# Patient Record
Sex: Male | Born: 1935 | Race: Black or African American | Hispanic: No | Marital: Single | Smoking: Never smoker
Health system: Southern US, Community
[De-identification: ages and names within clinical notes are randomized; demographics above are authoritative.]

## PROBLEM LIST (undated history)

## (undated) DIAGNOSIS — I459 Conduction disorder, unspecified: Secondary | ICD-10-CM

## (undated) DIAGNOSIS — M7989 Other specified soft tissue disorders: Secondary | ICD-10-CM

## (undated) DIAGNOSIS — M79643 Pain in unspecified hand: Secondary | ICD-10-CM

## (undated) DIAGNOSIS — Z950 Presence of cardiac pacemaker: Secondary | ICD-10-CM

## (undated) DIAGNOSIS — M8580 Other specified disorders of bone density and structure, unspecified site: Secondary | ICD-10-CM

## (undated) DIAGNOSIS — I82629 Acute embolism and thrombosis of deep veins of unspecified upper extremity: Secondary | ICD-10-CM

## (undated) DIAGNOSIS — R079 Chest pain, unspecified: Secondary | ICD-10-CM

## (undated) HISTORY — DX: Other specified soft tissue disorders: M79.89

## (undated) HISTORY — DX: Presence of cardiac pacemaker: Z95.0

## (undated) HISTORY — DX: Conduction disorder, unspecified: I45.9

## (undated) HISTORY — PX: PACEMAKER INSERTION: SHX728

## (undated) HISTORY — DX: Pain in unspecified hand: M79.643

## (undated) HISTORY — DX: Other specified disorders of bone density and structure, unspecified site: M85.80

## (undated) HISTORY — DX: Acute embolism and thrombosis of deep veins of unspecified upper extremity: I82.629

## (undated) HISTORY — DX: Chest pain, unspecified: R07.9

---

## 1999-07-11 ENCOUNTER — Encounter: Payer: Self-pay | Admitting: Emergency Medicine

## 1999-07-11 ENCOUNTER — Emergency Department (HOSPITAL_COMMUNITY): Admission: EM | Admit: 1999-07-11 | Discharge: 1999-07-11 | Payer: Self-pay | Admitting: Emergency Medicine

## 2000-02-02 ENCOUNTER — Emergency Department (HOSPITAL_COMMUNITY): Admission: EM | Admit: 2000-02-02 | Discharge: 2000-02-02 | Payer: Self-pay | Admitting: Emergency Medicine

## 2000-02-02 ENCOUNTER — Encounter: Payer: Self-pay | Admitting: Emergency Medicine

## 2000-02-05 ENCOUNTER — Emergency Department (HOSPITAL_COMMUNITY): Admission: EM | Admit: 2000-02-05 | Discharge: 2000-02-05 | Payer: Self-pay | Admitting: Emergency Medicine

## 2000-02-06 ENCOUNTER — Inpatient Hospital Stay (HOSPITAL_COMMUNITY): Admission: EM | Admit: 2000-02-06 | Discharge: 2000-02-19 | Payer: Self-pay | Admitting: Emergency Medicine

## 2000-02-07 ENCOUNTER — Encounter: Payer: Self-pay | Admitting: Family Medicine

## 2000-02-08 ENCOUNTER — Encounter: Payer: Self-pay | Admitting: Family Medicine

## 2000-02-12 ENCOUNTER — Encounter: Payer: Self-pay | Admitting: Cardiovascular Disease

## 2000-02-13 ENCOUNTER — Encounter: Payer: Self-pay | Admitting: Cardiovascular Disease

## 2000-02-14 ENCOUNTER — Encounter: Payer: Self-pay | Admitting: Family Medicine

## 2000-02-18 ENCOUNTER — Encounter: Payer: Self-pay | Admitting: Family Medicine

## 2016-06-28 ENCOUNTER — Encounter (HOSPITAL_COMMUNITY): Payer: Self-pay

## 2016-06-28 ENCOUNTER — Inpatient Hospital Stay (HOSPITAL_COMMUNITY)
Admission: EM | Admit: 2016-06-28 | Discharge: 2016-07-01 | DRG: 259 | Disposition: A | Discharge status: Home / Self Care | Payer: Medicare Other | Attending: Cardiovascular Disease | Admitting: Cardiovascular Disease

## 2016-06-28 ENCOUNTER — Emergency Department (HOSPITAL_COMMUNITY): Payer: Medicare Other

## 2016-06-28 ENCOUNTER — Emergency Department (HOSPITAL_COMMUNITY)
Admit: 2016-06-28 | Discharge: 2016-06-28 | Disposition: A | Discharge status: Home / Self Care | Payer: Medicare Other | Attending: Emergency Medicine | Admitting: Emergency Medicine

## 2016-06-28 DIAGNOSIS — Z950 Presence of cardiac pacemaker: Secondary | ICD-10-CM

## 2016-06-28 DIAGNOSIS — I82621 Acute embolism and thrombosis of deep veins of right upper extremity: Secondary | ICD-10-CM | POA: Diagnosis present

## 2016-06-28 DIAGNOSIS — Z45010 Encounter for checking and testing of cardiac pacemaker pulse generator [battery]: Secondary | ICD-10-CM | POA: Diagnosis not present

## 2016-06-28 DIAGNOSIS — I10 Essential (primary) hypertension: Secondary | ICD-10-CM | POA: Diagnosis present

## 2016-06-28 DIAGNOSIS — M7989 Other specified soft tissue disorders: Secondary | ICD-10-CM | POA: Diagnosis not present

## 2016-06-28 DIAGNOSIS — Z23 Encounter for immunization: Secondary | ICD-10-CM | POA: Diagnosis present

## 2016-06-28 DIAGNOSIS — M79609 Pain in unspecified limb: Secondary | ICD-10-CM | POA: Diagnosis not present

## 2016-06-28 DIAGNOSIS — T82111A Breakdown (mechanical) of cardiac pulse generator (battery), initial encounter: Secondary | ICD-10-CM

## 2016-06-28 DIAGNOSIS — I442 Atrioventricular block, complete: Secondary | ICD-10-CM | POA: Diagnosis present

## 2016-06-28 DIAGNOSIS — Z9119 Patient's noncompliance with other medical treatment and regimen: Secondary | ICD-10-CM

## 2016-06-28 DIAGNOSIS — M79601 Pain in right arm: Secondary | ICD-10-CM

## 2016-06-28 LAB — COMPREHENSIVE METABOLIC PANEL
ALT: 14 U/L — ABNORMAL LOW (ref 17–63)
AST: 22 U/L (ref 15–41)
Albumin: 3.6 g/dL (ref 3.5–5.0)
Alkaline Phosphatase: 62 U/L (ref 38–126)
Anion gap: 13 (ref 5–15)
BILIRUBIN TOTAL: 1.3 mg/dL — AB (ref 0.3–1.2)
BUN: 15 mg/dL (ref 6–20)
CHLORIDE: 101 mmol/L (ref 101–111)
CO2: 24 mmol/L (ref 22–32)
Calcium: 9 mg/dL (ref 8.9–10.3)
Creatinine, Ser: 1.1 mg/dL (ref 0.61–1.24)
GFR calc Af Amer: >60 mL/min (ref >60)
GFR calc non Af Amer: >60 mL/min (ref >60)
GLUCOSE: 158 mg/dL — AB (ref 65–99)
POTASSIUM: 3.8 mmol/L (ref 3.5–5.1)
SODIUM: 138 mmol/L (ref 135–145)
TOTAL PROTEIN: 7.5 g/dL (ref 6.5–8.1)

## 2016-06-28 LAB — CREATININE, SERUM
Creatinine, Ser: 1.15 mg/dL (ref 0.61–1.24)
GFR calc Af Amer: >60 mL/min (ref >60)
GFR calc non Af Amer: 59 mL/min — ABNORMAL LOW (ref >60)

## 2016-06-28 LAB — CBC
HCT: 43.1 % (ref 39.0–52.0)
HEMATOCRIT: 44.5 % (ref 39.0–52.0)
HEMOGLOBIN: 14.1 g/dL (ref 13.0–17.0)
Hemoglobin: 14.6 g/dL (ref 13.0–17.0)
MCH: 22.8 pg — ABNORMAL LOW (ref 26.0–34.0)
MCH: 22.7 pg — AB (ref 26.0–34.0)
MCHC: 32.8 g/dL (ref 30.0–36.0)
MCHC: 32.7 g/dL (ref 30.0–36.0)
MCV: 69.5 fL — ABNORMAL LOW (ref 78.0–100.0)
MCV: 69.4 fL — AB (ref 78.0–100.0)
Platelets: 218 10*3/uL (ref 150–400)
Platelets: 199 10*3/uL (ref 150–400)
RBC: 6.4 MIL/uL — ABNORMAL HIGH (ref 4.22–5.81)
RBC: 6.21 MIL/uL — AB (ref 4.22–5.81)
RDW: 14.3 % (ref 11.5–15.5)
RDW: 14.3 % (ref 11.5–15.5)
WBC: 6.4 10*3/uL (ref 4.0–10.5)
WBC: 6 10*3/uL (ref 4.0–10.5)

## 2016-06-28 LAB — URINALYSIS, ROUTINE W REFLEX MICROSCOPIC
Bilirubin Urine: NEGATIVE
Glucose, UA: NEGATIVE mg/dL
Hgb urine dipstick: NEGATIVE
Ketones, ur: NEGATIVE mg/dL
LEUKOCYTES UA: NEGATIVE
NITRITE: NEGATIVE
PH: 5 (ref 5.0–8.0)
Protein, ur: NEGATIVE mg/dL
SPECIFIC GRAVITY, URINE: 1.016 (ref 1.005–1.030)

## 2016-06-28 LAB — PROTIME-INR
INR: 1.1
PROTHROMBIN TIME: 14.3 s (ref 11.4–15.2)

## 2016-06-28 LAB — TSH: TSH: 1.647 u[IU]/mL (ref 0.350–4.500)

## 2016-06-28 LAB — I-STAT TROPONIN, ED: Troponin i, poc: 0.01 ng/mL (ref 0.00–0.08)

## 2016-06-28 MED ORDER — PNEUMOCOCCAL VAC POLYVALENT 25 MCG/0.5ML IJ INJ
0.5000 mL | INJECTION | INTRAMUSCULAR | Status: AC
  Administered 2016-06-29: 0.5 mL via INTRAMUSCULAR
  Filled 2016-06-28: qty 0.5

## 2016-06-28 MED ORDER — HEPARIN SODIUM (PORCINE) 5000 UNIT/ML IJ SOLN
5000.0000 [IU] | Freq: Three times a day (TID) | INTRAMUSCULAR | Status: DC
  Administered 2016-06-28 – 2016-07-01 (×7): 5000 [IU] via SUBCUTANEOUS
  Filled 2016-06-28 (×7): qty 1

## 2016-06-28 NOTE — ED Provider Notes (Signed)
[facility]-EMERGENCY DEPT Provider Note   CSN: [id number] Arrival date & time: 06/28/16  1130     History   Chief Complaint Chief Complaint  Patient presents with  . Hand Pain    HPI Steve Chapman is a 82 y.o. male.  The history is provided by the patient.  Extremity Pain  This is a new problem. Episode onset: 2 weeks ago. The problem occurs constantly. The problem has been gradually worsening. Pertinent negatives include no chest pain and no shortness of breath. Nothing aggravates the symptoms. Nothing relieves the symptoms. He has tried nothing for the symptoms.    History reviewed. No pertinent past medical history.  There are no active problems to display for this patient.   Past Surgical History:  Procedure Laterality Date  . PACEMAKER INSERTION         Home Medications    Prior to Admission medications   Not on File    Family History History reviewed. No pertinent family history.  Social History Social History  Substance Use Topics  . Smoking status: Never Smoker  . Smokeless tobacco: Never Used  . Alcohol use Not on file     Allergies   Patient has no known allergies.   Review of Systems Review of Systems  Respiratory: Negative for shortness of breath.   Cardiovascular: Negative for chest pain.  All other systems reviewed and are negative.    Physical Exam Updated Vital Signs BP 192/66   Pulse (!) 40   Temp 98.7 F (37.1 C) (Oral)   Resp 20   Ht 5\' 6"  (1.676 m)   Wt 150 lb (68 kg)   SpO2 99%   BMI 24.21 kg/m   Physical Exam  Constitutional: He is oriented to person, place, and time. He appears well-developed and well-nourished. No distress.  HENT:  Head: Normocephalic and atraumatic.  Nose: Nose normal.  Eyes: Conjunctivae are normal.  Neck: Neck supple. No tracheal deviation present.  Cardiovascular: Regular rhythm and normal heart sounds.  Bradycardia present.   Pulmonary/Chest: Effort normal and breath sounds normal. No  respiratory distress.  Abdominal: Soft. He exhibits no distension.  Musculoskeletal:  RUE swelling and tenderness of right hand  Neurological: He is alert and oriented to person, place, and time.  Skin: Skin is warm and dry.  Psychiatric: He has a normal mood and affect.     ED Treatments / Results  Labs (all labs ordered are listed, but only abnormal results are displayed) Labs Reviewed  CBC - Abnormal; Notable for the following:       Result Value   RBC 6.40 (*)    MCV 69.5 (*)    MCH 22.8 (*)    All other components within normal limits  COMPREHENSIVE METABOLIC PANEL - Abnormal; Notable for the following:    Glucose, Bld 158 (*)    ALT 14 (*)    Total Bilirubin 1.3 (*)    All other components within normal limits  CBC - Abnormal; Notable for the following:    RBC 6.21 (*)    MCV 69.4 (*)    MCH 22.7 (*)    All other components within normal limits  CREATININE, SERUM - Abnormal; Notable for the following:    GFR calc non Af Amer 59 (*)    All other components within normal limits  URINALYSIS, ROUTINE W REFLEX MICROSCOPIC  PROTIME-INR  TSH  I-STAT TROPOININ, ED    EKG  EKG Interpretation  Date/Time:  Saturday June 28 2016 11:58:49  EST Ventricular Rate:  40 PR Interval:    QRS Duration: 130 QT Interval:  506 QTC Calculation: 412 R Axis:   -10 Text Interpretation:  Complete (3-degree) AV block Left ventricular hypertrophy with QRS widening Inferior infarct , age undetermined Abnormal ECG Confirmed by Jannell Franta MD, Reuel BoomANIEL [phone]([id number]) on 06/28/2016 12:40:51 PM       Radiology Dg Hand 2 View Right  Result Date: 06/28/2016 CLINICAL DATA:  82 year old male with pain and swelling of the right hand, no known injury EXAM: RIGHT HAND - 2 VIEW COMPARISON:  None. FINDINGS: The bones appear diffusely demineralized, particularly in the juxta articular regions of the phalanges. There is soft tissue swelling involving the digits in the ulnar aspect of the hand. No evidence of acute  fracture or malalignment. No imbedded radiopaque foreign object. Minimal degenerative changes in the distal interphalangeal joint consistent with very mild osteoarthritis. IMPRESSION: Soft tissue swelling without evidence of acute osseous abnormalities or radiopaque foreign body. Juxta-articular osteopenia. Does the patient have a clinical history of inflammatory arthropathy? Electronically Signed   By: Malachy MoanHeath  McCullough M.D.   On: 06/28/2016 13:06   Dg Chest Portable 1 View  Result Date: 06/28/2016 CLINICAL DATA:  Midsternal chest pain. EXAM: PORTABLE CHEST 1 VIEW COMPARISON:  None. FINDINGS: A 2 lead pacemaker is identified over the right chest. Two transcutaneous pacers obscure the left medial lower chest. No nodules, masses, or focal infiltrates. The cardiomediastinal silhouette is unremarkable. IMPRESSION: No active disease. Electronically Signed   By: Gerome Samavid  Williams III M.D   On: 06/28/2016 13:06    Procedures Procedures (including critical care time)  CRITICAL CARE Performed by: Lyndal PulleyKnott, Arrayah Connors Total critical care time: 30 minutes Critical care time was exclusive of separately billable procedures and treating other patients. Critical care was necessary to treat or prevent imminent or life-threatening deterioration. Critical care was time spent personally by me on the following activities: development of treatment plan with patient and/or surrogate as well as nursing, discussions with consultants, evaluation of patient's response to treatment, examination of patient, obtaining history from patient or surrogate, ordering and performing treatments and interventions, ordering and review of laboratory studies, ordering and review of radiographic studies, pulse oximetry and re-evaluation of patient's condition.  Medications Ordered in ED Medications - No data to display   Initial Impression / Assessment and Plan / ED Course  I have reviewed the triage vital signs and the nursing  notes.  Pertinent labs & imaging results that were available during my care of the patient were reviewed by me and considered in my medical decision making (see chart for details).  Clinical Course     82 y.o. male presents with complaint of right arm pain and swelling. EKG from triage shows complete heart block. Discussed with cardiology who came to see Pt and it is believed he did not receive any f/u after remote pacemaker placement and likely has a nonfunctional pacer. It is right sided so US done to eval for DVT and is negative. Pt is poor historian and chronically noncompliant. Cardiology to admit for pacer replacement.   Final Clinical Impressions(s) / ED Diagnoses   Final diagnoses:  Right arm pain  Complete heart block [facility](HCC)    New Prescriptions New Prescriptions   No medications on file     Lyndal Pulleyaniel Yulian Gosney, MD 06/28/16 2102

## 2016-06-28 NOTE — ED Notes (Signed)
Cardiologist at bedside.  

## 2016-06-28 NOTE — ED Triage Notes (Signed)
Pt presents with 2-3 day h/o midsternal chest pain.  Pt reports pain is intermittent and does not radiate.  +shortness of breath, denies nausea.  Pt also reports pain and swelling to R hand, denies any injury, pain x 1 week.

## 2016-06-28 NOTE — ED Notes (Signed)
Assisted pt with bedside commode. Pt had a large loose bowel movement.

## 2016-06-28 NOTE — H&P (Signed)
 Chief Complaint:  Right hand swelling and pain  HPI:  This is an 82 y.o. male with a past medical history significant for pacemaker implantation in 2001 for unknown diagnosis. He has not seen the implanting physician (Dr. Weintraub) and at least the last 6-1/2 years (when the office was still on [address] and has not had a pacemaker check in at least that length of time. Device manufacturer unknown. 2 leads seen on CXR.  He presents today with roughly 2 month complaints of intermittent swelling and discomfort in his right hand. It appears that elevating the limb will lead to improvement in the swelling but then it returns. Recently he has also had some intense but brief and sharp chest pain. It is not clearly pleuritic. He has had some dry nonproductive cough without hemoptysis. He denies dyspnea and has not had exertional angina or exertional shortness of breath. He denies syncope or palpitations.  Incidentally noted during his ER evaluation is the fact that his pacemaker is completely nonfunctional. He has complete heart block with an idioventricular escape rhythm at about 37-40 bpm.  As far as I can tell he has not had significant medical problems. 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   Specifically denies a history of hypertension or diabetes mellitus, PAD, previous stroke or TIA, previous syncope or congestive heart failure. He has never smoked. The only medications he takes is an occasional Tylenol for his hand discomfort.  He lives alone. He is accompanied to the emergency department by his niece Beth and grandniece Kendra. They are concerned about his ability to take care of himself at home  PMHx:  History reviewed. No pertinent past medical history.  Past Surgical History:  Procedure Laterality Date  . PACEMAKER INSERTION      FAMHx:  History reviewed. No pertinent family history.  SOCHx:   reports that he has never  smoked. He has never used smokeless tobacco. His alcohol and drug histories are not on file.  ALLERGIES:  No Known Allergies  ROS: Pertinent items noted in HPI and remainder of comprehensive ROS otherwise negative.  HOME MEDS:  (Not in a hospital admission)  LABS/IMAGING: Results for orders placed or performed during the hospital encounter of 06/28/16 (from the past 48 hour(s))  CBC     Status: Abnormal   Collection Time: 06/28/16 11:55 AM  Result Value Ref Range   WBC 6.4 4.0 - 10.5 K/uL   RBC 6.40 (H) 4.22 - 5.81 MIL/uL   Hemoglobin 14.6 13.0 - 17.0 g/dL   HCT 44.5 39.0 - 52.0 %   MCV 69.5 (L) 78.0 - 100.0 fL   MCH 22.8 (L) 26.0 - 34.0 pg   MCHC 32.8 30.0 - 36.0 g/dL   RDW 14.3 11.5 - 15.5 %   Platelets 218 150 - 400 K/uL  Comprehensive metabolic panel     Status: Abnormal   Collection Time: 06/28/16 11:55 AM  Result Value Ref Range   Sodium 138 135 - 145 mmol/L   Potassium 3.8 3.5 - 5.1 mmol/L   Chloride 101 101 - 111 mmol/L   CO2 24 22 - 32 mmol/L   Glucose, Bld 158 (H) 65 - 99 mg/dL   BUN 15 6 - 20 mg/dL   Creatinine, Ser 1.10 0.61 - 1.24 mg/dL   Calcium 9.0 8.9 - 10.3 mg/dL   Total Protein 7.5 6.5 - 8.1 g/dL   Albumin 3.6 3.5 -   5.0 g/dL   AST 22 15 - 41 U/L   ALT 14 (L) 17 - 63 U/L   Alkaline Phosphatase 62 38 - 126 U/L   Total Bilirubin 1.3 (H) 0.3 - 1.2 mg/dL   GFR calc non Af Amer >60 >60 mL/min   GFR calc Af Amer >60 >60 mL/min    Comment: (NOTE) The eGFR has been calculated using the CKD EPI equation. This calculation has not been validated in all clinical situations. eGFR's persistently <60 mL/min signify possible Chronic Kidney Disease.    Anion gap 13 5 - 15  I-stat troponin, ED (not at [facility])     Status: None   Collection Time: 06/28/16 12:17 PM  Result Value Ref Range   Troponin i, poc 0.01 0.00 - 0.08 ng/mL   Comment 3            Comment: Due to the release kinetics of cTnI, a negative result within the first hours of the onset of  symptoms does not rule out myocardial infarction with certainty. If myocardial infarction is still suspected, repeat the test at appropriate intervals.    Dg Hand 2 View Right  Result Date: 06/28/2016 CLINICAL DATA:  82 year old male with pain and swelling of the right hand, no known injury EXAM: RIGHT HAND - 2 VIEW COMPARISON:  None. FINDINGS: The bones appear diffusely demineralized, particularly in the juxta articular regions of the phalanges. There is soft tissue swelling involving the digits in the ulnar aspect of the hand. No evidence of acute fracture or malalignment. No imbedded radiopaque foreign object. Minimal degenerative changes in the distal interphalangeal joint consistent with very mild osteoarthritis. IMPRESSION: Soft tissue swelling without evidence of acute osseous abnormalities or radiopaque foreign body. Juxta-articular osteopenia. Does the patient have a clinical history of inflammatory arthropathy? Electronically Signed   By: Jacqulynn Cadet M.D.   On: 06/28/2016 13:06   Dg Chest Portable 1 View  Result Date: 06/28/2016 CLINICAL DATA:  Midsternal chest pain. EXAM: PORTABLE CHEST 1 VIEW COMPARISON:  None. FINDINGS: A 2 lead pacemaker is identified over the right chest. Two transcutaneous pacers obscure the left medial lower chest. No nodules, masses, or focal infiltrates. The cardiomediastinal silhouette is unremarkable. IMPRESSION: No active disease. Electronically Signed   By: Dorise Bullion III M.D   On: 06/28/2016 13:06    VITALS: Blood pressure (!) 161/51, pulse (!) 37, temperature 98.7 F (37.1 C), temperature source Oral, resp. rate 14, height 5' 6" (1.676 m), weight 150 lb (68 kg), SpO2 100 %.  EXAM:  General: Alert, oriented x3, no distress Head: no evidence of trauma, PERRL, EOMI, no exophtalmos or lid lag, no myxedema, no xanthelasma; normal ears, nose and oropharynx Neck: no jugular venous pulsations and no hepatojugular reflux; brisk carotid pulses without  delay and no carotid bruits Chest: clear to auscultation, no signs of consolidation by percussion or palpation, normal fremitus, symmetrical and full respiratory excursions Cardiovascular: normal position and quality of the apical impulse, regular rhythm, normal first heart sound and splito second heart sounds, no rubs or gallops, nmurmur Abdomen: no tenderness or distention, no masses by palpation, no abnormal pulsatility or arterial bruits, normal bowel sounds, no hepatosplenomegaly Extremities: no clubbing, cyanosis or edema; 2+ radial, ulnar and brachial pulses bilaterally; 2+ right femoral, posterior tibial and dorsalis pedis pulses; 2+ left femoral, posterior tibial and dorsalis pedis pulses; no subclavian or femoral bruits Neurological: grossly nonfocal   IMPRESSION:  82 year old man with complete heart block and a completely nonfunctional  pacemaker (quite likely nonfunctional for years), but asymptomatic with idioventricular escape rhythm. Also has complaints of right arm swelling and sharp chest pain.  PLAN:  1. CHB: He will need a new pacemaker generator. We are unable to test his chronic leads but hopefully there are still functional. Will only be able to find out at the time of generator replacement. The procedure is not urgent, that there is substantial concern that he may not return for medical care due to his previous history of noncompliance. We'll keep him here until we can organize the procedure as an inpatient. 2. Possible right upper extremity DVT: He may have DVT related to the pacemaker leads as a cause for his arm swelling. Will order an ultrasound. Conceivably, his sharp chest pain might actually represent pulmonary infarction secondary to embolism. We'll also order an echocardiogram to look for signs of right heart abnormalities. ECG is not diagnostic for coronary insufficiency due to idioventricular rhythm. 3. HTN: He has a very broad pulse pressure and it is likely that his  systolic hypertension is simply related to the bradycardia.  Mihai Croitoru, MD, FACC [facility] [phone] office [phone] pager  06/28/2016, 1:34 PM  

## 2016-06-28 NOTE — Progress Notes (Addendum)
New pt admission from ED. Pt brought to the floor in stable condition. Vitals taken. Initial Assessment done. All immediate pertinent needs to patient addressed. Patient Guide given to patient. Important safety instructions relating to hospitalization reviewed with patient. Patient verbalized understanding. Family members are in bed side, pt's systolic blood pressure is high (190/65), informed to MD, he said he is not going to do anything right now and it might be related with his pacemaker, will continue to monitor the patient.  Lonia Farberekha, RN

## 2016-06-28 NOTE — ED Notes (Signed)
Md at bedside evaluating pt. Pt reports no chest pain currently but occasional chest pain radiating to arm. Pt also has noted swelling to right hand. Pt came in for pain to right hand.

## 2016-06-28 NOTE — Progress Notes (Signed)
I have called [employer], [employer], [employer] and [employer]. No record of this patient's device is located.

## 2016-06-28 NOTE — Progress Notes (Signed)
VASCULAR LAB PRELIMINARY  PRELIMINARY  PRELIMINARY  PRELIMINARY  Left upper extremity venous duplex completed.    Preliminary report:  There is no DVT or SVT noted in the right upper extremity, including the right hand.  Gave report to Dr. Ronn MelenaKnott  Prudie Guthridge, [facility], RVT 06/28/2016, 3:20 PM

## 2016-06-29 LAB — SURGICAL PCR SCREEN
MRSA, PCR: NEGATIVE
Staphylococcus aureus: NEGATIVE

## 2016-06-29 MED ORDER — CEFAZOLIN SODIUM-DEXTROSE 2-4 GM/100ML-% IV SOLN
2.0000 g | INTRAVENOUS | Status: AC
  Administered 2016-06-30: 2 g via INTRAVENOUS
  Filled 2016-06-29: qty 100

## 2016-06-29 MED ORDER — SODIUM CHLORIDE 0.9 % IV SOLN
INTRAVENOUS | Status: DC
  Administered 2016-06-30: 06:00:00 via INTRAVENOUS

## 2016-06-29 MED ORDER — SODIUM CHLORIDE 0.9 % IR SOLN
80.0000 mg | Status: AC
  Administered 2016-06-30: 80 mg
  Filled 2016-06-29: qty 2

## 2016-06-29 NOTE — Consult Note (Signed)
HPI:  This is a 82 y.o. male with a past medical history significant for pacemaker implantation in 2001 for unknown diagnosis. He has not seen the implanting physician (Dr. Rollene Fare) and at least the last 6-1/2 years (when the office was still on [employer] and has not had a pacemaker check in at least that length of time. Device manufacturer unknown. 2 leads seen on CXR.  He presents today with roughly 2 month complaints of intermittent swelling and discomfort in his right hand. It appears that elevating the limb will lead to improvement in the swelling but then it returns. Recently he has also had some intense but brief and sharp chest pain. It is not clearly pleuritic. He has had some dry nonproductive cough without hemoptysis. He denies dyspnea and has not had exertional angina or exertional shortness of breath. He denies syncope or palpitations.  Incidentally noted during his ER evaluation is the fact that his pacemaker is completely nonfunctional. He has complete heart block with an idioventricular escape rhythm at about 37-40 bpm.  As far as I can tell he has not had significant medical problems. 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   Specifically denies a history of hypertension or diabetes mellitus, PAD, previous stroke or TIA, previous syncope or congestive heart failure. He has never smoked. The only medications he takes is an occasional Tylenol for his hand discomfort.  He lives alone. He is accompanied to the emergency department by his niece [place] and [place]. They are concerned about his ability to take care of himself at home  PMHx:  History reviewed. No pertinent past medical history.       Past Surgical History:  Procedure Laterality Date  . PACEMAKER INSERTION      FAMHx:  History reviewed. No pertinent family history.  SOCHx:   reports that he has never smoked. He has never used smokeless  tobacco. His alcohol and drug histories are not on file.  ALLERGIES:  No Known Allergies  ROS: Pertinent items noted in HPI and remainder of comprehensive ROS otherwise negative.  HOME MEDS:  (Not in a hospital admission)  LABS/IMAGING: Lab Results Last 48 Hours        Results for orders placed or performed during the hospital encounter of 06/28/16 (from the past 48 hour(s))  CBC     Status: Abnormal   Collection Time: 06/28/16 11:55 AM  Result Value Ref Range   WBC 6.4 4.0 - 10.5 K/uL   RBC 6.40 (H) 4.22 - 5.81 MIL/uL   Hemoglobin 14.6 13.0 - 17.0 g/dL   HCT 44.5 39.0 - 52.0 %   MCV 69.5 (L) 78.0 - 100.0 fL   MCH 22.8 (L) 26.0 - 34.0 pg   MCHC 32.8 30.0 - 36.0 g/dL   RDW 14.3 11.5 - 15.5 %   Platelets 218 150 - 400 K/uL  Comprehensive metabolic panel     Status: Abnormal   Collection Time: 06/28/16 11:55 AM  Result Value Ref Range   Sodium 138 135 - 145 mmol/L   Potassium 3.8 3.5 - 5.1 mmol/L   Chloride 101 101 - 111 mmol/L   CO2 24 22 - 32 mmol/L   Glucose, Bld 158 (H) 65 - 99 mg/dL   BUN 15 6 - 20 mg/dL   Creatinine, Ser 1.10 0.61 - 1.24 mg/dL   Calcium 9.0 8.9 - 10.3 mg/dL   Total Protein 7.5 6.5 -  8.1 g/dL   Albumin 3.6 3.5 - 5.0 g/dL   AST 22 15 - 41 U/L   ALT 14 (L) 17 - 63 U/L   Alkaline Phosphatase 62 38 - 126 U/L   Total Bilirubin 1.3 (H) 0.3 - 1.2 mg/dL   GFR calc non Af Amer >60 >60 mL/min   GFR calc Af Amer >60 >60 mL/min    Comment: (NOTE) The eGFR has been calculated using the CKD EPI equation. This calculation has not been validated in all clinical situations. eGFR's persistently <60 mL/min signify possible Chronic Kidney Disease.   Anion gap 13 5 - 15  I-stat troponin, ED (not at [facility])     Status: None   Collection Time: 06/28/16 12:17 PM  Result Value Ref Range   Troponin i, poc 0.01 0.00 - 0.08 ng/mL   Comment 3            Comment: Due to the release kinetics of cTnI, a negative result within  the first hours of the onset of symptoms does not rule out myocardial infarction with certainty. If myocardial infarction is still suspected, repeat the test at appropriate intervals.      Imaging Results (Last 48 hours)  Dg Hand 2 View Right  Result Date: 06/28/2016 CLINICAL DATA:  82 year old male with pain and swelling of the right hand, no known injury EXAM: RIGHT HAND - 2 VIEW COMPARISON:  None. FINDINGS: The bones appear diffusely demineralized, particularly in the juxta articular regions of the phalanges. There is soft tissue swelling involving the digits in the ulnar aspect of the hand. No evidence of acute fracture or malalignment. No imbedded radiopaque foreign object. Minimal degenerative changes in the distal interphalangeal joint consistent with very mild osteoarthritis. IMPRESSION: Soft tissue swelling without evidence of acute osseous abnormalities or radiopaque foreign body. Juxta-articular osteopenia. Does the patient have a clinical history of inflammatory arthropathy? Electronically Signed   By: Jacqulynn Cadet M.D.   On: 06/28/2016 13:06   Dg Chest Portable 1 View  Result Date: 06/28/2016 CLINICAL DATA:  Midsternal chest pain. EXAM: PORTABLE CHEST 1 VIEW COMPARISON:  None. FINDINGS: A 2 lead pacemaker is identified over the right chest. Two transcutaneous pacers obscure the left medial lower chest. No nodules, masses, or focal infiltrates. The cardiomediastinal silhouette is unremarkable. IMPRESSION: No active disease. Electronically Signed   By: Dorise Bullion III M.D   On: 06/28/2016 13:06     VITALS: Blood pressure (!) 161/51, pulse (!) 37, temperature 98.7 F (37.1 C), temperature source Oral, resp. rate 14, height 5' 6"  (1.676 m), weight 150 lb (68 kg), SpO2 100 %.  EXAM:  General: Alert, oriented x3, no distress Head: no evidence of trauma, PERRL, EOMI, no exophtalmos or lid lag, no myxedema, no xanthelasma; normal ears, nose and oropharynx Neck: no  jugular venous pulsations and no hepatojugular reflux; brisk carotid pulses without delay and no carotid bruits Chest: clear to auscultation, no signs of consolidation by percussion or palpation, normal fremitus, symmetrical and full respiratory excursions Cardiovascular: normal position and quality of the apical impulse, regular rhythm, normal first heart sound and splito second heart sounds, no rubs or gallops, nmurmur Abdomen: no tenderness or distention, no masses by palpation, no abnormal pulsatility or arterial bruits, normal bowel sounds, no hepatosplenomegaly Extremities: no clubbing, cyanosis or edema; 2+ radial, ulnar and brachial pulses bilaterally; 2+ right femoral, posterior tibial and dorsalis pedis pulses; 2+ left femoral, posterior tibial and dorsalis pedis pulses; no subclavian or femoral bruits. Dorsum  of his right hand is tender but not particularly swollen or erythematous. Neurological: grossly nonfocal   IMPRESSION:  82 year old man with complete heart block and a completely nonfunctional pacemaker (quite likely nonfunctional for years), but asymptomatic with idioventricular escape rhythm. Also has complaints of right arm swelling and sharp chest pain.  PLAN:  1. CHB: He will need a new pacemaker generator. We are unable to test his chronic leads but hopefully there are still functional. Will only be able to find out at the time of generator replacement. The procedure is not urgent, that there is substantial concern that he may not return for medical care due to his previous history of noncompliance. We'll keep him here until we can organize the procedure as an inpatient. 2. Possible right upper extremity DVT: He may have DVT related to the pacemaker leads as a cause for his arm swelling. Will order an ultrasound. Conceivably, his sharp chest pain might actually represent pulmonary infarction secondary to embolism. We'll also order an echocardiogram to look for signs of right  heart abnormalities. ECG is not diagnostic for coronary insufficiency due to idioventricular rhythm. 3. HTN: He has a very broad pulse pressure and it is likely that his systolic hypertension is simply related to the bradycardia.   EP Attending  Patient seen and examined. I concur with the findings as noted above. He presents with hand swelling and tenderness and was found to be bradycardic with CHB and a narrow escape. His PPM inserted over 16 years ago is dead. Will plan to place a new PPM tomorrow. Will be prepared to add a new lead or two if his current are found to be functioning incorrectly (cannot assess currently due to dead device). I suspect he will need some NSAIDS for his hand. I have discussed the risks/benefits/goals/expectations of device implant and he wishes to proceed.  Mikle Bosworth.D.

## 2016-06-30 ENCOUNTER — Encounter (HOSPITAL_COMMUNITY)
Admission: EM | Disposition: A | Discharge status: Home / Self Care | Payer: Self-pay | Source: Home / Self Care | Attending: Cardiovascular Disease

## 2016-06-30 DIAGNOSIS — I442 Atrioventricular block, complete: Secondary | ICD-10-CM

## 2016-06-30 DIAGNOSIS — Z45010 Encounter for checking and testing of cardiac pacemaker pulse generator [battery]: Secondary | ICD-10-CM

## 2016-06-30 HISTORY — PX: EP IMPLANTABLE DEVICE: SHX172B

## 2016-06-30 SURGERY — PPM GENERATOR CHANGEOUT
Anesthesia: LOCAL

## 2016-06-30 MED ORDER — CEFAZOLIN IN D5W 1 GM/50ML IV SOLN
1.0000 g | Freq: Four times a day (QID) | INTRAVENOUS | Status: AC
  Administered 2016-07-01 (×3): 1 g via INTRAVENOUS
  Filled 2016-06-30 (×3): qty 50

## 2016-06-30 MED ORDER — ONDANSETRON HCL 4 MG/2ML IJ SOLN: 4.0000 mg | Freq: Four times a day (QID) | INTRAMUSCULAR | Status: DC | PRN

## 2016-06-30 MED ORDER — LIDOCAINE HCL (PF) 1 % IJ SOLN
INTRAMUSCULAR | Status: AC
  Filled 2016-06-30: qty 60

## 2016-06-30 MED ORDER — CEFAZOLIN SODIUM-DEXTROSE 2-4 GM/100ML-% IV SOLN
INTRAVENOUS | Status: AC
Start: 2016-06-30 — End: 2016-06-30
  Filled 2016-06-30: qty 100

## 2016-06-30 MED ORDER — SODIUM CHLORIDE 0.9 % IR SOLN
Status: AC
  Filled 2016-06-30: qty 2

## 2016-06-30 MED ORDER — LIDOCAINE HCL (PF) 1 % IJ SOLN
INTRAMUSCULAR | Status: DC | PRN
  Administered 2016-06-30: 45 mL via INTRADERMAL

## 2016-06-30 MED ORDER — HEPARIN (PORCINE) IN NACL 2-0.9 UNIT/ML-% IJ SOLN
INTRAMUSCULAR | Status: DC | PRN
  Administered 2016-06-30: 17:00:00

## 2016-06-30 MED ORDER — ACETAMINOPHEN 325 MG PO TABS: 325.0000 mg | ORAL_TABLET | ORAL | Status: DC | PRN

## 2016-06-30 MED ORDER — HEPARIN (PORCINE) IN NACL 2-0.9 UNIT/ML-% IJ SOLN
INTRAMUSCULAR | Status: AC
  Filled 2016-06-30: qty 500

## 2016-06-30 SURGICAL SUPPLY — 8 items
CABLE SURGICAL S-101-97-12 (CABLE) ×4 IMPLANT
DEVICE TORQUE .025-.038 (MISCELLANEOUS) ×2 IMPLANT
ETRINSA 8 DR-T 394931 (Pacemaker) ×2 IMPLANT
GUIDEWIRE ANGLED .035X150CM (WIRE) ×2 IMPLANT
LEAD SOLIA S PRO MRI 45 (Lead) IMPLANT
PAD DEFIB LIFELINK (PAD) ×2 IMPLANT
SHEATH CLASSIC 7F (SHEATH) ×2 IMPLANT
TRAY PACEMAKER INSERTION (PACKS) ×2 IMPLANT

## 2016-06-30 NOTE — Progress Notes (Signed)
SUBJECTIVE: The patient is doing well today.  At this time, he denies chest pain, shortness of breath, or any new concerns.  Marland Kitchen.  ceFAZolin (ANCEF) IV  2 g Intravenous To Cath  . gentamicin irrigation  80 mg Irrigation To Cath  . heparin  5,000 Units Subcutaneous Q8H   . sodium chloride 50 mL/hr at 06/30/16 0628    OBJECTIVE: Physical Exam: Vitals:   06/29/16 1201 06/29/16 1700 06/29/16 2000 06/30/16 0405  BP: (!) 187/63 (!) 160/40 (!) 162/42 (!) 141/76  Pulse: (!) 37  (!) 38 (!) 36  Resp: 20  18 18   Temp: 97.6 F (36.4 C)  98 F (36.7 C) 98 F (36.7 C)  TempSrc: Oral  Oral Oral  SpO2: 99%  99% 100%  Weight:    178 lb 8 oz (81 kg)  Height:        Intake/Output Summary (Last 24 hours) at 06/30/16 0855 Last data filed at 06/30/16 0800  Gross per 24 hour  Intake              940 ml  Output             1100 ml  Net             -160 ml    Telemetry reveals sinus rhythm with complete heart block  GEN- The patient is well appearing, alert and oriented x 3 today.   Head- normocephalic, atraumatic Eyes-  Sclera clear, conjunctiva pink Ears- hearing intact Oropharynx- clear Neck- supple  Lungs- Clear to ausculation bilaterally, normal work of breathing Heart- bradycardic regular rate and rhythm GI- soft, NT, ND, + BS Extremities- no clubbing, cyanosis, or edema Skin- no rash or lesion, right chest PPM incision well healed Psych- euthymic mood, full affect Neuro- strength and sensation are intact  LABS: Basic Metabolic Panel:  Recent Labs  16/03/9600/06/18 1155 06/28/16 1538  NA 138  --   K 3.8  --   CL 101  --   CO2 24  --   GLUCOSE 158*  --   BUN 15  --   CREATININE 1.10 1.15  CALCIUM 9.0  --    Liver Function Tests:  Recent Labs  06/28/16 1155  AST 22  ALT 14*  ALKPHOS 62  BILITOT 1.3*  PROT 7.5  ALBUMIN 3.6  CBC:  Recent Labs  06/28/16 1155 06/28/16 1538  WBC 6.4 6.0  HGB 14.6 14.1  HCT 44.5 43.1  MCV 69.5* 69.4*  PLT 218 199   Thyroid  Function Tests:  Recent Labs  06/28/16 1538  TSH 1.647    RADIOLOGY: Dg Chest Portable 1 View Result Date: 06/28/2016 CLINICAL DATA:  Midsternal chest pain. EXAM: PORTABLE CHEST 1 VIEW COMPARISON:  None. FINDINGS: A 2 lead pacemaker is identified over the right chest. Two transcutaneous pacers obscure the left medial lower chest. No nodules, masses, or focal infiltrates. The cardiomediastinal silhouette is unremarkable. IMPRESSION: No active disease. Electronically Signed   By: Gerome Samavid  Williams III M.D   On: 06/28/2016 13:06    ASSESSMENT AND PLAN:  Active Problems:   Complete heart block (HCC)  1.  Complete heart block Plan PPM generator change today with possible lead revision Risks, benefits reviewed with the patient who wishes to proceed  2.  HTN Stable No change required today  Gypsy BalsamAmber Seiler, NP 06/30/2016 8:56 AM  EP Attending  Patient seen and examined. Agree with above. He is stable for PPM generator change out.  Leonia ReevesGregg Taylor,M.D.

## 2016-06-30 NOTE — Interval H&P Note (Signed)
History and Physical Interval Note:  06/30/2016 4:33 PM  Steve Chapman  has presented today for surgery, with the diagnosis of ERI  The various methods of treatment have been discussed with the patient and family. After consideration of risks, benefits and other options for treatment, the patient has consented to  Procedure(s): PPM Generator Changeout (N/A) as a surgical intervention .  The patient's history has been reviewed, patient examined, no change in status, stable for surgery.  I have reviewed the patient's chart and labs.  Questions were answered to the patient's satisfaction.     Lewayne BuntingGregg Taylor

## 2016-06-30 NOTE — H&P (View-Only) (Signed)
SUBJECTIVE: The patient is doing well today.  At this time, he denies chest pain, shortness of breath, or any new concerns.  .  ceFAZolin (ANCEF) IV  2 g Intravenous To Cath  . gentamicin irrigation  80 mg Irrigation To Cath  . heparin  5,000 Units Subcutaneous Q8H   . sodium chloride 50 mL/hr at 06/30/16 0628    OBJECTIVE: Physical Exam: Vitals:   06/29/16 1201 06/29/16 1700 06/29/16 2000 06/30/16 0405  BP: (!) 187/63 (!) 160/40 (!) 162/42 (!) 141/76  Pulse: (!) 37  (!) 38 (!) 36  Resp: 20  18 18  Temp: 97.6 F (36.4 C)  98 F (36.7 C) 98 F (36.7 C)  TempSrc: Oral  Oral Oral  SpO2: 99%  99% 100%  Weight:    178 lb 8 oz (81 kg)  Height:        Intake/Output Summary (Last 24 hours) at 06/30/16 0855 Last data filed at 06/30/16 0800  Gross per 24 hour  Intake              940 ml  Output             1100 ml  Net             -160 ml    Telemetry reveals sinus rhythm with complete heart block  GEN- The patient is well appearing, alert and oriented x 3 today.   Head- normocephalic, atraumatic Eyes-  Sclera clear, conjunctiva pink Ears- hearing intact Oropharynx- clear Neck- supple  Lungs- Clear to ausculation bilaterally, normal work of breathing Heart- bradycardic regular rate and rhythm GI- soft, NT, ND, + BS Extremities- no clubbing, cyanosis, or edema Skin- no rash or lesion, right chest PPM incision well healed Psych- euthymic mood, full affect Neuro- strength and sensation are intact  LABS: Basic Metabolic Panel:  Recent Labs  06/28/16 1155 06/28/16 1538  NA 138  --   K 3.8  --   CL 101  --   CO2 24  --   GLUCOSE 158*  --   BUN 15  --   CREATININE 1.10 1.15  CALCIUM 9.0  --    Liver Function Tests:  Recent Labs  06/28/16 1155  AST 22  ALT 14*  ALKPHOS 62  BILITOT 1.3*  PROT 7.5  ALBUMIN 3.6  CBC:  Recent Labs  06/28/16 1155 06/28/16 1538  WBC 6.4 6.0  HGB 14.6 14.1  HCT 44.5 43.1  MCV 69.5* 69.4*  PLT 218 199   Thyroid  Function Tests:  Recent Labs  06/28/16 1538  TSH 1.647    RADIOLOGY: Dg Chest Portable 1 View Result Date: 06/28/2016 CLINICAL DATA:  Midsternal chest pain. EXAM: PORTABLE CHEST 1 VIEW COMPARISON:  None. FINDINGS: A 2 lead pacemaker is identified over the right chest. Two transcutaneous pacers obscure the left medial lower chest. No nodules, masses, or focal infiltrates. The cardiomediastinal silhouette is unremarkable. IMPRESSION: No active disease. Electronically Signed   By: David  Williams III M.D   On: 06/28/2016 13:06    ASSESSMENT AND PLAN:  Active Problems:   Complete heart block (HCC)  1.  Complete heart block Plan PPM generator change today with possible lead revision Risks, benefits reviewed with the patient who wishes to proceed  2.  HTN Stable No change required today  Amber Seiler, NP 06/30/2016 8:56 AM  EP Attending  Patient seen and examined. Agree with above. He is stable for PPM generator change out.  Gregg Taylor,M.D.    

## 2016-07-01 ENCOUNTER — Inpatient Hospital Stay (HOSPITAL_COMMUNITY): Payer: Medicare Other

## 2016-07-01 ENCOUNTER — Encounter (HOSPITAL_COMMUNITY): Payer: Self-pay | Admitting: Internal Medicine

## 2016-07-01 MED ORDER — APIXABAN 5 MG PO TABS: 5.0000 mg | ORAL_TABLET | Freq: Two times a day (BID) | ORAL | 2 refills | Status: DC

## 2016-07-01 NOTE — Progress Notes (Signed)
Date- 07/01/2016 Time- 1900  Pt family member called regarding pt eliquis copay. Went over with pt family member again how to activate the card for a $10 copay.   Steve Chapman Elige RadonBradley

## 2016-07-01 NOTE — Progress Notes (Signed)
Pt discharged via wheelchair with volunteer and family member. Pt IV discontinued and catheter intact, and telemetry removed. Pt has all belongs, discharge paperwork and prescription discount card.  Steve Chapman Steve Chapman

## 2016-07-01 NOTE — Progress Notes (Signed)
Patient and family given discharge instructions inlcuding follow up appointment, wound care, and new medications. Patient's family given discount card for new medication and verbalizes understanding of all discharge instructions.

## 2016-07-01 NOTE — Discharge Summary (Signed)
ELECTROPHYSIOLOGY PROCEDURE DISCHARGE SUMMARY    Patient ID: Steve Chapman,  MRN: [id number], DOB/AGE: [birth year] 82 y.o.  Admit date: 06/28/2016 Discharge date: 07/01/2016  Primary Care Physician: No primary care provider on file. Primary Cardiologist: previously Dr Alanda AmassWeintraub Electrophysiologist: Ladona Ridgelaylor  Primary Discharge Diagnosis:  1.  Symptomatic complete heart block status post pacemaker generator change this admission  No Known Allergies   Procedures This Admission:  1.  Implantation of a Biotronik dual chamber PPM on 06/30/16 by Dr Ladona Ridgelaylor.  The previously implanted RA and RV leads were used.  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 The device was programmed DVI 70 to allow for AV synchrony. There were no immediate post procedure complications. 2.  CXR on 07/01/2016 demonstrated no pneumothorax status post device implantation.  3. Ultrasound of the right arm - acute DVT  Brief HPI/Hospital Course: Steve Chapman is a 82 y.o. male who underwent PPM implant in 2001 by Dr Alanda AmassWeintraub for complete heart block and has not had outpatient follow up.  He presented to the ER on the day of admission with complaints of intermittent right hand swelling.  Ultrasound demonstrated an acute DVT. He was found to be in complete heart block on arrival.  His PPM was unable to be interrogated 2/2 battery depletion.  Risks, benefits, and alternatives to PPM generator change were reviewed with the patient who wished to proceed.  The patient underwent implantation of a Biotronik dual chamber PPM with details as outlined above.  He  was monitored on telemetry overnight which demonstrated AV pacing.  Left chest was without hematoma or ecchymosis.  The device was interrogated and found to be functioning normally.  CXR was obtained and demonstrated no pneumothorax status post device implantation.  Wound care, arm mobility, and  restrictions were reviewed with the patient.  The patient was examined and considered stable for discharge to home.    Physical Exam: Vitals:   06/30/16 1830 06/30/16 1844 07/01/16 0031 07/01/16 0632  BP: (!) 163/90 (!) 162/96 139/67 136/74  Pulse: 74 70 70 68  Resp: 17 18 16 17   Temp:   98.2 F (36.8 C) 98.4 F (36.9 C)  TempSrc:   Oral Oral  SpO2: 100% 100% 99% 99%  Weight:    179 lb 4.8 oz (81.3 kg)  Height:        GEN- The patient is well appearing, alert and oriented x 3 today.   HEENT: normocephalic, atraumatic; sclera clear, conjunctiva pink; hearing intact; oropharynx clear; neck supple Lungs- Clear to ausculation bilaterally, normal work of breathing.  No wheezes, rales, rhonchi Heart- Regular rate and rhythm (paced) GI- soft, non-tender, non-distended, bowel sounds present  Extremities- no clubbing, cyanosis, or edema; DP/PT/radial pulses 2+ bilaterally MS- no significant deformity or atrophy Skin- warm and dry, no rash or lesion, left chest without hematoma/ecchymosis Psych- euthymic mood, full affect Neuro- strength and sensation are intact   Labs:   Lab Results  Component Value Date   WBC 6.0 06/28/2016   HGB 14.1 06/28/2016   HCT 43.1 06/28/2016   MCV 69.4 (L) 06/28/2016   PLT 199 06/28/2016     Recent Labs Lab 06/28/16 1155 06/28/16 1538  NA 138  --   K 3.8  --   CL 101  --   CO2 24  --   BUN 15  --   CREATININE 1.10 1.15  CALCIUM 9.0  --  PROT 7.5  --   BILITOT 1.3*  --   ALKPHOS 62  --   ALT 14*  --   AST 22  --   GLUCOSE 158*  --     Discharge Medications:  Allergies as of 07/01/2016   No Known Allergies     Medication List    TAKE these medications   acetaminophen 500 MG tablet Commonly known as:  TYLENOL Take 500 mg by mouth every 6 (six) hours as needed for mild pain.       Disposition:  Discharge Instructions    Diet - low sodium heart healthy    Complete by:  As directed    Increase activity slowly    Complete by:   As directed      Follow-up Information    Lewayne Bunting, MD Follow up on 07/14/2016.   Specialty:  Cardiology Why:  at 3:30 PM for wound check with device nurses Contact information: [address] [place] [address] [phone]           Duration of Discharge Encounter: Greater than 30 minutes including physician time.  Signed, Gypsy Balsam, NP 07/01/2016 9:12 AM  EP Attending  Patient seen and examined. Agree with above. The patient is stable after PPM generator change out. He has a right upper extremity DVT by ultrasound. He has oversensing on atrial lead and is programmed DVI. His device is working this morning and he is AV pacing appropriately. Stable for DC home. Would like to start anti-coagulation tomorrow night.  Leonia Reeves.D.

## 2016-07-01 NOTE — Discharge Instructions (Signed)
Keep incision clean and dry for 7 days Call the office for redness, swelling, drainage, or fever

## 2016-07-14 ENCOUNTER — Ambulatory Visit: Payer: Medicare Other

## 2016-07-18 ENCOUNTER — Ambulatory Visit (INDEPENDENT_AMBULATORY_CARE_PROVIDER_SITE_OTHER): Payer: Medicare Other | Admitting: *Deleted

## 2016-07-18 DIAGNOSIS — I442 Atrioventricular block, complete: Secondary | ICD-10-CM

## 2016-07-18 NOTE — Progress Notes (Signed)
Wound check appointment. Steri-strips removed. Wound without redness or edema. Incision edges approximated, wound well healed. Industry present during device interrogation Normal device function. Thresholds, sensing, and impedances consistent with implant measurements. RA output reprogrammed from 3.0V to 2.0V, RV output from 2.0V to 3.0V.Marland Kitchen. Histogram distribution appropriate for patient and level of activity. No mode switches or high ventricular rates noted. Patient educated about wound care, arm mobility. ROV 09/25/2016 w/ GT

## 2016-07-23 ENCOUNTER — Telehealth: Payer: Self-pay

## 2016-07-23 NOTE — Telephone Encounter (Signed)
Patient Assist Form for Eliquis [facility] This is ONLY MD portion of the application. Didn't receive patient's portion.

## 2016-07-29 ENCOUNTER — Other Ambulatory Visit: Payer: Self-pay | Admitting: Internal Medicine

## 2016-08-01 ENCOUNTER — Telehealth: Payer: Self-pay | Admitting: Internal Medicine

## 2016-08-01 NOTE — Telephone Encounter (Signed)
Pt was given samples of Eliquis 5 mg, 2 boxes, 2 weeks supply.LOT # [id number]  Exp: 08/2018, left at the front desk for pt to pick up. Pt is aware.

## 2016-08-05 ENCOUNTER — Telehealth: Payer: Self-pay

## 2016-08-05 ENCOUNTER — Telehealth: Payer: Self-pay | Admitting: Internal Medicine

## 2016-08-05 NOTE — Telephone Encounter (Signed)
Walk In Pt Form-please call pt regarding medication Placed in doc box.

## 2016-08-05 NOTE — Telephone Encounter (Signed)
Called, spoke with pt. Informed [employer] faxed over needed info for assistance w/ Eliquis. Proof of income and proof of income for all members in the household. Informed pt to contact [employer] with info. Pt verbalized understanding and thanked me for calling.

## 2016-08-08 ENCOUNTER — Telehealth: Payer: Self-pay | Admitting: Internal Medicine

## 2016-08-08 NOTE — Telephone Encounter (Signed)
New Message    [facility] never received any application for this patient, please call with more information

## 2016-08-11 NOTE — Telephone Encounter (Signed)
This has been sent on 07/23/2016. I will send it again. They have faxed their portion separately.

## 2016-08-18 ENCOUNTER — Telehealth: Payer: Self-pay | Admitting: Internal Medicine

## 2016-08-18 NOTE — Telephone Encounter (Signed)
New message ° ° ° ° ° °Patient calling the office for samples of medication: ° ° °1.  What medication and dosage are you requesting samples for? Eliquis 5 mg  ° °2.  Are you currently out of this medication? Yes  ° ° ° °

## 2016-08-18 NOTE — Telephone Encounter (Signed)
PLACED 2 BOXES OF ELIQUIS AT FRONT DESK, PT AWARE AS THEY AWAIT THE APPROVAL FROM [employer]  WILL ALSO GET A VERBAL CONSENT FOR EC TO BE ABLE TO DISCUSS PHI.

## 2016-09-25 ENCOUNTER — Encounter: Payer: Medicare Other | Admitting: Internal Medicine

## 2016-10-08 ENCOUNTER — Encounter: Payer: Self-pay | Admitting: Internal Medicine

## 2017-04-16 ENCOUNTER — Encounter: Payer: Self-pay | Admitting: Internal Medicine

## 2017-04-20 ENCOUNTER — Ambulatory Visit (INDEPENDENT_AMBULATORY_CARE_PROVIDER_SITE_OTHER): Payer: Medicare Other | Admitting: *Deleted

## 2017-04-20 DIAGNOSIS — I442 Atrioventricular block, complete: Secondary | ICD-10-CM | POA: Diagnosis not present

## 2017-04-21 NOTE — Progress Notes (Signed)
Remote pacemaker transmission.   

## 2017-04-28 ENCOUNTER — Encounter: Payer: Self-pay | Admitting: Cardiology

## 2017-04-28 LAB — CUP PACEART REMOTE DEVICE CHECK
Date Time Interrogation Session: 20181106113209
Implantable Lead Implant Date: 20010822
Implantable Lead Location: 753859
Implantable Lead Model: 4035
Implantable Lead Model: 4064
Implantable Lead Serial Number: 302015
Implantable Lead Serial Number: 303342
Implantable Pulse Generator Implant Date: 20180108
MDC IDC LEAD IMPLANT DT: 20010822
MDC IDC LEAD LOCATION: 753860
MDC IDC PG SERIAL: 68936255

## 2017-05-04 ENCOUNTER — Encounter: Payer: Self-pay | Admitting: Internal Medicine

## 2017-07-20 ENCOUNTER — Ambulatory Visit (INDEPENDENT_AMBULATORY_CARE_PROVIDER_SITE_OTHER): Payer: Medicare Other | Admitting: *Deleted

## 2017-07-20 DIAGNOSIS — I442 Atrioventricular block, complete: Secondary | ICD-10-CM

## 2017-07-20 NOTE — Progress Notes (Signed)
Remote pacemaker transmission.   

## 2017-07-22 ENCOUNTER — Encounter: Payer: Self-pay | Admitting: Cardiology

## 2017-08-05 LAB — CUP PACEART REMOTE DEVICE CHECK
Date Time Interrogation Session: 20190213111701
Implantable Lead Implant Date: 20010822
Implantable Lead Location: 753859
Implantable Lead Model: 4064
Implantable Lead Serial Number: 302015
Implantable Pulse Generator Implant Date: 20180108
MDC IDC LEAD IMPLANT DT: 20010822
MDC IDC LEAD LOCATION: 753860
MDC IDC LEAD SERIAL: 303342
MDC IDC PG SERIAL: 68936255
Pulse Gen Model: 394931

## 2017-10-19 ENCOUNTER — Ambulatory Visit (INDEPENDENT_AMBULATORY_CARE_PROVIDER_SITE_OTHER): Payer: Medicare Other | Admitting: *Deleted

## 2017-10-19 DIAGNOSIS — I442 Atrioventricular block, complete: Secondary | ICD-10-CM

## 2017-10-19 NOTE — Progress Notes (Signed)
Remote pacemaker transmission.   

## 2017-10-20 ENCOUNTER — Encounter: Payer: Self-pay | Admitting: Cardiology

## 2017-10-20 NOTE — Progress Notes (Signed)
Letter  

## 2017-10-21 LAB — CUP PACEART REMOTE DEVICE CHECK
Brady Statistic AP VP Percent: 100 %
Brady Statistic AP VS Percent: 0 %
Brady Statistic RA Percent Paced: 100 %
Date Time Interrogation Session: 20190501053352
Implantable Lead Implant Date: 20010822
Implantable Lead Location: 753860
Implantable Lead Serial Number: 302015
Implantable Lead Serial Number: 303342
Implantable Pulse Generator Implant Date: 20180108
Lead Channel Impedance Value: 827 Ohm
Lead Channel Setting Pacing Amplitude: 3 V
Lead Channel Setting Pacing Pulse Width: 0.75 ms
MDC IDC LEAD IMPLANT DT: 20010822
MDC IDC LEAD LOCATION: 753859
MDC IDC MSMT BATTERY REMAINING PERCENTAGE: 85 %
MDC IDC MSMT LEADCHNL RA IMPEDANCE VALUE: 240 Ohm
MDC IDC SET LEADCHNL RA PACING AMPLITUDE: 2.4 V
MDC IDC STAT BRADY RV PERCENT PACED: 100 %
Pulse Gen Serial Number: 68936255

## 2017-12-15 ENCOUNTER — Telehealth: Payer: Self-pay | Admitting: Cardiology

## 2017-12-15 NOTE — Telephone Encounter (Signed)
LMOVM for pt to return call in regards to disconnected home monitor since 11-24-17.

## 2017-12-22 NOTE — Telephone Encounter (Signed)
2nd attempt  LMOVM for pt to return call.  

## 2017-12-25 ENCOUNTER — Encounter: Payer: Self-pay | Admitting: Cardiology

## 2017-12-25 NOTE — Telephone Encounter (Signed)
3rd attempt   LMOVM for pt to return call.  

## 2018-01-18 ENCOUNTER — Ambulatory Visit (INDEPENDENT_AMBULATORY_CARE_PROVIDER_SITE_OTHER): Payer: Medicare Other | Admitting: *Deleted

## 2018-01-18 DIAGNOSIS — I442 Atrioventricular block, complete: Secondary | ICD-10-CM | POA: Diagnosis not present

## 2018-01-18 NOTE — Progress Notes (Signed)
Remote pacemaker transmission.   

## 2018-01-19 ENCOUNTER — Encounter: Payer: Self-pay | Admitting: Cardiology

## 2018-03-03 LAB — CUP PACEART REMOTE DEVICE CHECK
Brady Statistic RV Percent Paced: 100 %
Implantable Lead Implant Date: 20010822
Implantable Lead Location: 753859
Implantable Lead Location: 753860
Implantable Lead Model: 4064
Implantable Lead Serial Number: 303342
Lead Channel Impedance Value: 293 Ohm
Lead Channel Impedance Value: 839 Ohm
Lead Channel Setting Pacing Amplitude: 2.4 V
Lead Channel Setting Pacing Amplitude: 3 V
Lead Channel Setting Pacing Pulse Width: 0.75 ms
MDC IDC LEAD IMPLANT DT: 20010822
MDC IDC LEAD SERIAL: 302015
MDC IDC MSMT LEADCHNL RV SENSING INTR AMPL: 8.3 mV
MDC IDC PG IMPLANT DT: 20180108
MDC IDC SESS DTM: 20190911134750
MDC IDC STAT BRADY RA PERCENT PACED: 100 %
Pulse Gen Serial Number: 68936255

## 2018-04-19 ENCOUNTER — Ambulatory Visit (INDEPENDENT_AMBULATORY_CARE_PROVIDER_SITE_OTHER): Payer: Medicare Other | Admitting: *Deleted

## 2018-04-19 DIAGNOSIS — I442 Atrioventricular block, complete: Secondary | ICD-10-CM

## 2018-04-20 NOTE — Progress Notes (Signed)
Remote pacemaker transmission.   

## 2018-06-22 LAB — CUP PACEART REMOTE DEVICE CHECK
Date Time Interrogation Session: 20191231072830
Implantable Lead Implant Date: 20010822
Implantable Lead Location: 753860
Implantable Lead Model: 4035
Implantable Lead Serial Number: 303342
Implantable Pulse Generator Implant Date: 20180108
MDC IDC LEAD IMPLANT DT: 20010822
MDC IDC LEAD LOCATION: 753859
MDC IDC LEAD SERIAL: 302015
MDC IDC PG SERIAL: 68936255

## 2018-07-19 ENCOUNTER — Ambulatory Visit (INDEPENDENT_AMBULATORY_CARE_PROVIDER_SITE_OTHER): Payer: Medicare Other

## 2018-07-19 DIAGNOSIS — I442 Atrioventricular block, complete: Secondary | ICD-10-CM

## 2018-07-20 NOTE — Progress Notes (Signed)
Remote pacemaker transmission.   

## 2018-07-22 LAB — CUP PACEART REMOTE DEVICE CHECK
Date Time Interrogation Session: 20200130150701
Implantable Lead Implant Date: 20010822
Implantable Lead Location: 753859
Implantable Lead Location: 753860
Implantable Lead Serial Number: 302015
Implantable Pulse Generator Implant Date: 20180108
MDC IDC LEAD IMPLANT DT: 20010822
MDC IDC LEAD SERIAL: 303342
Pulse Gen Model: 394931
Pulse Gen Serial Number: 68936255

## 2018-08-05 ENCOUNTER — Emergency Department (HOSPITAL_COMMUNITY): Payer: Medicare Other

## 2018-08-05 ENCOUNTER — Emergency Department (HOSPITAL_COMMUNITY)
Admission: EM | Admit: 2018-08-05 | Discharge: 2018-08-05 | Discharge status: Against Medical Advice | Payer: Medicare Other | Attending: Emergency Medicine | Admitting: Emergency Medicine

## 2018-08-05 ENCOUNTER — Encounter (HOSPITAL_COMMUNITY): Payer: Self-pay | Admitting: Obstetrics and Gynecology

## 2018-08-05 ENCOUNTER — Other Ambulatory Visit: Payer: Self-pay

## 2018-08-05 DIAGNOSIS — M25562 Pain in left knee: Secondary | ICD-10-CM | POA: Diagnosis not present

## 2018-08-05 DIAGNOSIS — Z5321 Procedure and treatment not carried out due to patient leaving prior to being seen by health care provider: Secondary | ICD-10-CM | POA: Diagnosis not present

## 2018-08-05 NOTE — ED Notes (Signed)
Registration states pt left earlier.

## 2018-08-05 NOTE — ED Triage Notes (Signed)
Per EMS: Pt is coming from home with a c/o knee pain and fall. Pt reports he has a hx of falls. Pt reports pain on the posterior part of knee.  No obvious swelling or deformity, but tender with palpation.  Pt denies LOC, blood thinners, or hitting his head.

## 2018-08-05 NOTE — ED Notes (Signed)
Bed: [facility] Expected date:  Expected time:  Means of arrival:  Comments: 

## 2018-08-20 ENCOUNTER — Telehealth: Payer: Self-pay | Admitting: Cardiology

## 2018-08-20 NOTE — Telephone Encounter (Signed)
LMOVM for pt to return call. His home monitor is disconnected.

## 2018-08-24 NOTE — Telephone Encounter (Signed)
2nd attempt  LMOVM for pt to return call.  

## 2018-09-07 NOTE — Telephone Encounter (Signed)
Monitor updated

## 2018-10-18 ENCOUNTER — Other Ambulatory Visit: Payer: Self-pay

## 2018-10-18 ENCOUNTER — Ambulatory Visit (INDEPENDENT_AMBULATORY_CARE_PROVIDER_SITE_OTHER): Payer: Medicare Other | Admitting: *Deleted

## 2018-10-18 DIAGNOSIS — I442 Atrioventricular block, complete: Secondary | ICD-10-CM

## 2018-10-19 LAB — CUP PACEART REMOTE DEVICE CHECK
Date Time Interrogation Session: 20200428102550
Implantable Lead Implant Date: 20010822
Implantable Lead Implant Date: 20010822
Implantable Lead Location: 753859
Implantable Lead Location: 753860
Implantable Lead Model: 4035
Implantable Lead Model: 4064
Implantable Lead Serial Number: 302015
Implantable Lead Serial Number: 303342
Implantable Pulse Generator Implant Date: 20180108
Pulse Gen Model: 394931
Pulse Gen Serial Number: 68936255

## 2018-10-26 NOTE — Progress Notes (Signed)
Remote pacemaker transmission.   

## 2018-12-21 ENCOUNTER — Encounter: Payer: Medicare Other | Admitting: Internal Medicine

## 2019-01-03 ENCOUNTER — Telehealth: Payer: Self-pay | Admitting: Internal Medicine

## 2019-01-03 NOTE — Telephone Encounter (Signed)

## 2019-01-04 ENCOUNTER — Ambulatory Visit (INDEPENDENT_AMBULATORY_CARE_PROVIDER_SITE_OTHER): Payer: Medicare Other | Admitting: Internal Medicine

## 2019-01-04 ENCOUNTER — Encounter: Payer: Self-pay | Admitting: Internal Medicine

## 2019-01-04 ENCOUNTER — Other Ambulatory Visit: Payer: Self-pay

## 2019-01-04 VITALS — BP 116/80 | HR 140 | Ht 64.0 in | Wt 134.0 lb

## 2019-01-04 DIAGNOSIS — Z950 Presence of cardiac pacemaker: Secondary | ICD-10-CM | POA: Diagnosis not present

## 2019-01-04 DIAGNOSIS — I442 Atrioventricular block, complete: Secondary | ICD-10-CM

## 2019-01-04 LAB — CUP PACEART INCLINIC DEVICE CHECK
Date Time Interrogation Session: 20200714111441
Implantable Lead Implant Date: 20010822
Implantable Lead Implant Date: 20010822
Implantable Lead Location: 753859
Implantable Lead Location: 753860
Implantable Lead Model: 4035
Implantable Lead Model: 4064
Implantable Lead Serial Number: 302015
Implantable Lead Serial Number: 303342
Implantable Pulse Generator Implant Date: 20180108
Pulse Gen Model: 394931
Pulse Gen Serial Number: 68936255

## 2019-01-04 NOTE — Progress Notes (Signed)
HPI Mr. Steve Chapman returns today after a long absence from our arrhythmia clinic. He is a pleasant 84 yo man with sinus node dysfunction and CHB. He has known undersensing as well as noise on his atrial lead and we have programmed him DVI at 70 in the past. In the interim, he denies chest pain or sob. No edema. His appetite is good. He has a remote DVT and was on eliquis but is off of this now. No Known Allergies   Current Outpatient Medications  Medication Sig Dispense Refill  . acetaminophen (TYLENOL) 500 MG tablet Take 500 mg by mouth every 6 (six) hours as needed for mild pain.    Marland Kitchen apixaban (ELIQUIS) 5 MG TABS tablet Take 1 tablet (5 mg total) by mouth 2 (two) times daily. 60 tablet 2   No current facility-administered medications for this visit.      Past Medical History:  Diagnosis Date  . Chest pain   . DVT of upper extremity (deep vein thrombosis) ([place])   . Heart block   . Intermittent pain and swelling of hand    RIGHT HAND  . Osteopenia   . Pacemaker     ROS:   All systems reviewed and negative except as noted in the HPI.   Past Surgical History:  Procedure Laterality Date  . EP IMPLANTABLE DEVICE N/A 06/30/2016   Procedure: PPM Generator Changeout;  Surgeon: Evans Lance, MD;  Location: [facility];  Service: Cardiovascular;  Laterality: N/A;  . PACEMAKER INSERTION       History reviewed. No pertinent family history.   Social History   Socioeconomic History  . Marital status: Single    Spouse name: Not on file  . Number of children: Not on file  . Years of education: Not on file  . Highest education level: Not on file  Occupational History  . Not on file  Social Needs  . Financial resource strain: Not on file  . Food insecurity    Worry: Not on file    Inability: Not on file  . Transportation needs    Medical: Not on file    Non-medical: Not on file  Tobacco Use  . Smoking status: Never Smoker  . Smokeless tobacco: Never Used   Substance and Sexual Activity  . Alcohol use: Not on file  . Drug use: Not on file  . Sexual activity: Not on file  Lifestyle  . Physical activity    Days per week: Not on file    Minutes per session: Not on file  . Stress: Not on file  Relationships  . Social Herbalist on phone: Not on file    Gets together: Not on file    Attends religious service: Not on file    Active member of club or organization: Not on file    Attends meetings of clubs or organizations: Not on file    Relationship status: Not on file  . Intimate partner violence    Fear of current or ex partner: Not on file    Emotionally abused: Not on file    Physically abused: Not on file    Forced sexual activity: Not on file  Other Topics Concern  . Not on file  Social History Narrative  . Not on file     BP 116/80   Pulse (!) 140   Ht 5\' 4"  (1.626 m)   Wt 134 lb (60.8 kg)  SpO2 96%   BMI 23.00 kg/m   Physical Exam:  Well appearing NAD HEENT: Unremarkable Neck:  No JVD, no thyromegally Lymphatics:  No adenopathy Back:  No CVA tenderness Lungs:  Clear HEART:  Regular rate rhythm, no murmurs, no rubs, no clicks Abd:  soft, positive bowel sounds, no organomegally, no rebound, no guarding Ext:  2 plus pulses, no edema, no cyanosis, no clubbing Skin:  No rashes no nodules Neuro:  CN II through XII intact, motor grossly intact  EKG - nsr with AV pacing  DEVICE  Normal device function.  See PaceArt for details.   Assess/Plan: 1. CHB - he is dependent. He is asymptomatic, s/p PPM. 2. PPM - his Biotronik device is functioning acceptable. He is undersensing the atrium and will will continue to AV pace at 70. I would anticipate having an atrial lead placed at gen change. 3. DVT - he is s/p treatment with Eliquis. We will follow.  Leonia ReevesGregg Verniece Encarnacion,M.D.

## 2019-01-04 NOTE — Patient Instructions (Addendum)
Medication Instructions:  Your physician recommends that you continue on your current medications as directed. Please refer to the Current Medication list given to you today.  Labwork: None ordered.  Testing/Procedures: None ordered.  Follow-Up: Your physician wants you to follow-up in: one year with Dr. Lovena Le.  You will receive a reminder letter in the mail two months in advance. If you don't receive a letter, please call our office to schedule the follow-up appointment.  Remote monitoring is used to monitor your Pacemaker from home. This monitoring reduces the number of office visits required to check your device to one time per year. It allows [place] to keep an eye on the functioning of your device to ensure it is working properly. You are scheduled for a device check from home on 01/17/2019. You may send your transmission at any time that day. If you have a wireless device, the transmission will be sent automatically. After your physician reviews your transmission, you will receive a postcard with your next transmission date.  Any Other Special Instructions Will Be Listed Below (If Applicable).  If you need a refill on your cardiac medications before your next appointment, please call your pharmacy.

## 2019-01-17 ENCOUNTER — Ambulatory Visit (INDEPENDENT_AMBULATORY_CARE_PROVIDER_SITE_OTHER): Payer: Medicare Other | Admitting: *Deleted

## 2019-01-17 DIAGNOSIS — I442 Atrioventricular block, complete: Secondary | ICD-10-CM

## 2019-01-19 LAB — CUP PACEART REMOTE DEVICE CHECK
Date Time Interrogation Session: 20200729163925
Implantable Lead Implant Date: 20010822
Implantable Lead Implant Date: 20010822
Implantable Lead Location: 753859
Implantable Lead Location: 753860
Implantable Lead Model: 4035
Implantable Lead Model: 4064
Implantable Lead Serial Number: 302015
Implantable Lead Serial Number: 303342
Implantable Pulse Generator Implant Date: 20180108
Pulse Gen Model: 394931
Pulse Gen Serial Number: 68936255

## 2019-02-03 NOTE — Progress Notes (Signed)
Remote pacemaker transmission.   

## 2019-04-19 ENCOUNTER — Ambulatory Visit (INDEPENDENT_AMBULATORY_CARE_PROVIDER_SITE_OTHER): Payer: Medicare Other | Admitting: *Deleted

## 2019-04-19 DIAGNOSIS — I442 Atrioventricular block, complete: Secondary | ICD-10-CM | POA: Diagnosis not present

## 2019-04-20 LAB — CUP PACEART REMOTE DEVICE CHECK
Date Time Interrogation Session: 20201028070620
Implantable Lead Implant Date: 20010822
Implantable Lead Implant Date: 20010822
Implantable Lead Location: 753859
Implantable Lead Location: 753860
Implantable Lead Model: 4035
Implantable Lead Model: 4064
Implantable Lead Serial Number: 302015
Implantable Lead Serial Number: 303342
Implantable Pulse Generator Implant Date: 20180108
Pulse Gen Model: 394931
Pulse Gen Serial Number: 68936255

## 2019-05-10 NOTE — Progress Notes (Signed)
Remote pacemaker transmission.   

## 2019-07-19 ENCOUNTER — Ambulatory Visit (INDEPENDENT_AMBULATORY_CARE_PROVIDER_SITE_OTHER): Payer: Medicare Other | Admitting: *Deleted

## 2019-07-19 DIAGNOSIS — I442 Atrioventricular block, complete: Secondary | ICD-10-CM

## 2019-07-20 LAB — CUP PACEART REMOTE DEVICE CHECK
Date Time Interrogation Session: 20210126185553
Implantable Lead Implant Date: 20010822
Implantable Lead Implant Date: 20010822
Implantable Lead Location: 753859
Implantable Lead Location: 753860
Implantable Lead Model: 4035
Implantable Lead Model: 4064
Implantable Lead Serial Number: 302015
Implantable Lead Serial Number: 303342
Implantable Pulse Generator Implant Date: 20180108
Pulse Gen Model: 394931
Pulse Gen Serial Number: 68936255

## 2019-10-18 ENCOUNTER — Ambulatory Visit (INDEPENDENT_AMBULATORY_CARE_PROVIDER_SITE_OTHER): Payer: Medicare Other | Admitting: *Deleted

## 2019-10-18 DIAGNOSIS — I442 Atrioventricular block, complete: Secondary | ICD-10-CM

## 2019-10-18 LAB — CUP PACEART REMOTE DEVICE CHECK
Date Time Interrogation Session: 20210427081958
Implantable Lead Implant Date: 20010822
Implantable Lead Implant Date: 20010822
Implantable Lead Location: 753859
Implantable Lead Location: 753860
Implantable Lead Model: 4035
Implantable Lead Model: 4064
Implantable Lead Serial Number: 302015
Implantable Lead Serial Number: 303342
Implantable Pulse Generator Implant Date: 20180108
Pulse Gen Model: 394931
Pulse Gen Serial Number: 68936255

## 2019-10-19 NOTE — Progress Notes (Signed)
PPM Remote  

## 2020-01-17 ENCOUNTER — Ambulatory Visit (INDEPENDENT_AMBULATORY_CARE_PROVIDER_SITE_OTHER): Payer: Medicare Other | Admitting: *Deleted

## 2020-01-17 DIAGNOSIS — I442 Atrioventricular block, complete: Secondary | ICD-10-CM | POA: Diagnosis not present

## 2020-01-18 LAB — CUP PACEART REMOTE DEVICE CHECK
Date Time Interrogation Session: 20210727165448
Implantable Lead Implant Date: 20010822
Implantable Lead Implant Date: 20010822
Implantable Lead Location: 753859
Implantable Lead Location: 753860
Implantable Lead Model: 4035
Implantable Lead Model: 4064
Implantable Lead Serial Number: 302015
Implantable Lead Serial Number: 303342
Implantable Pulse Generator Implant Date: 20180108
Pulse Gen Model: 394931
Pulse Gen Serial Number: 68936255

## 2020-01-20 NOTE — Progress Notes (Signed)
Remote pacemaker transmission.   

## 2020-04-17 ENCOUNTER — Ambulatory Visit (INDEPENDENT_AMBULATORY_CARE_PROVIDER_SITE_OTHER): Payer: Medicare Other

## 2020-04-17 DIAGNOSIS — I442 Atrioventricular block, complete: Secondary | ICD-10-CM

## 2020-04-17 LAB — CUP PACEART REMOTE DEVICE CHECK
Date Time Interrogation Session: 20211026092537
Implantable Lead Implant Date: 20010822
Implantable Lead Implant Date: 20010822
Implantable Lead Location: 753859
Implantable Lead Location: 753860
Implantable Lead Model: 4035
Implantable Lead Model: 4064
Implantable Lead Serial Number: 302015
Implantable Lead Serial Number: 303342
Implantable Pulse Generator Implant Date: 20180108
Pulse Gen Model: 394931
Pulse Gen Serial Number: 68936255

## 2020-04-23 NOTE — Progress Notes (Signed)
Remote pacemaker transmission.   

## 2020-07-17 ENCOUNTER — Ambulatory Visit (INDEPENDENT_AMBULATORY_CARE_PROVIDER_SITE_OTHER): Payer: Medicare Other

## 2020-07-17 DIAGNOSIS — I442 Atrioventricular block, complete: Secondary | ICD-10-CM | POA: Diagnosis not present

## 2020-07-17 LAB — CUP PACEART REMOTE DEVICE CHECK
Date Time Interrogation Session: 20220125072820
Implantable Lead Implant Date: 20010822
Implantable Lead Implant Date: 20010822
Implantable Lead Location: 753859
Implantable Lead Location: 753860
Implantable Lead Model: 4035
Implantable Lead Model: 4064
Implantable Lead Serial Number: 302015
Implantable Lead Serial Number: 303342
Implantable Pulse Generator Implant Date: 20180108
Pulse Gen Model: 394931
Pulse Gen Serial Number: 68936255

## 2020-07-28 NOTE — Progress Notes (Signed)
Remote pacemaker transmission.   

## 2020-10-16 ENCOUNTER — Ambulatory Visit (INDEPENDENT_AMBULATORY_CARE_PROVIDER_SITE_OTHER): Payer: Medicare Other

## 2020-10-16 DIAGNOSIS — I442 Atrioventricular block, complete: Secondary | ICD-10-CM

## 2020-10-16 LAB — CUP PACEART REMOTE DEVICE CHECK
Date Time Interrogation Session: 20220426064154
Implantable Lead Implant Date: 20010822
Implantable Lead Implant Date: 20010822
Implantable Lead Location: 753859
Implantable Lead Location: 753860
Implantable Lead Model: 4035
Implantable Lead Model: 4064
Implantable Lead Serial Number: 302015
Implantable Lead Serial Number: 303342
Implantable Pulse Generator Implant Date: 20180108
Pulse Gen Model: 394931
Pulse Gen Serial Number: 68936255

## 2020-11-06 NOTE — Progress Notes (Signed)
Remote pacemaker transmission.   

## 2021-01-05 IMAGING — CR DG KNEE COMPLETE 4+V*L*
4 series · 4 of 4 positions shown · non-contrast
Comparison: None.

CLINICAL DATA: Left knee pain after fall today.

EXAM:
LEFT KNEE - COMPLETE 4+ VIEW

[t knee obl left]
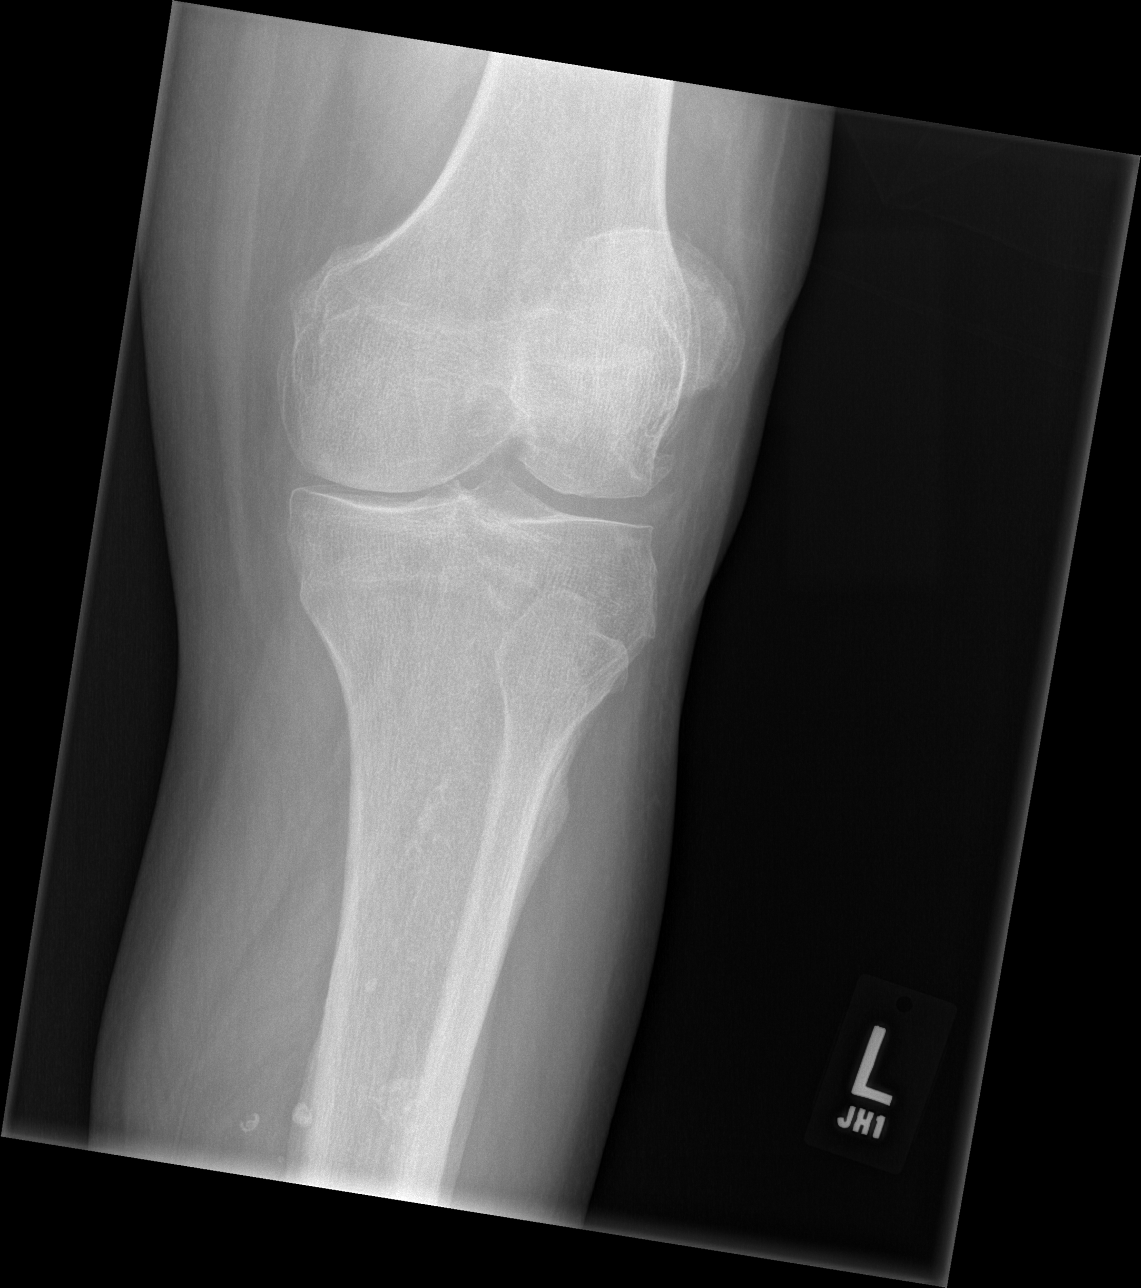

[t knee ap left]
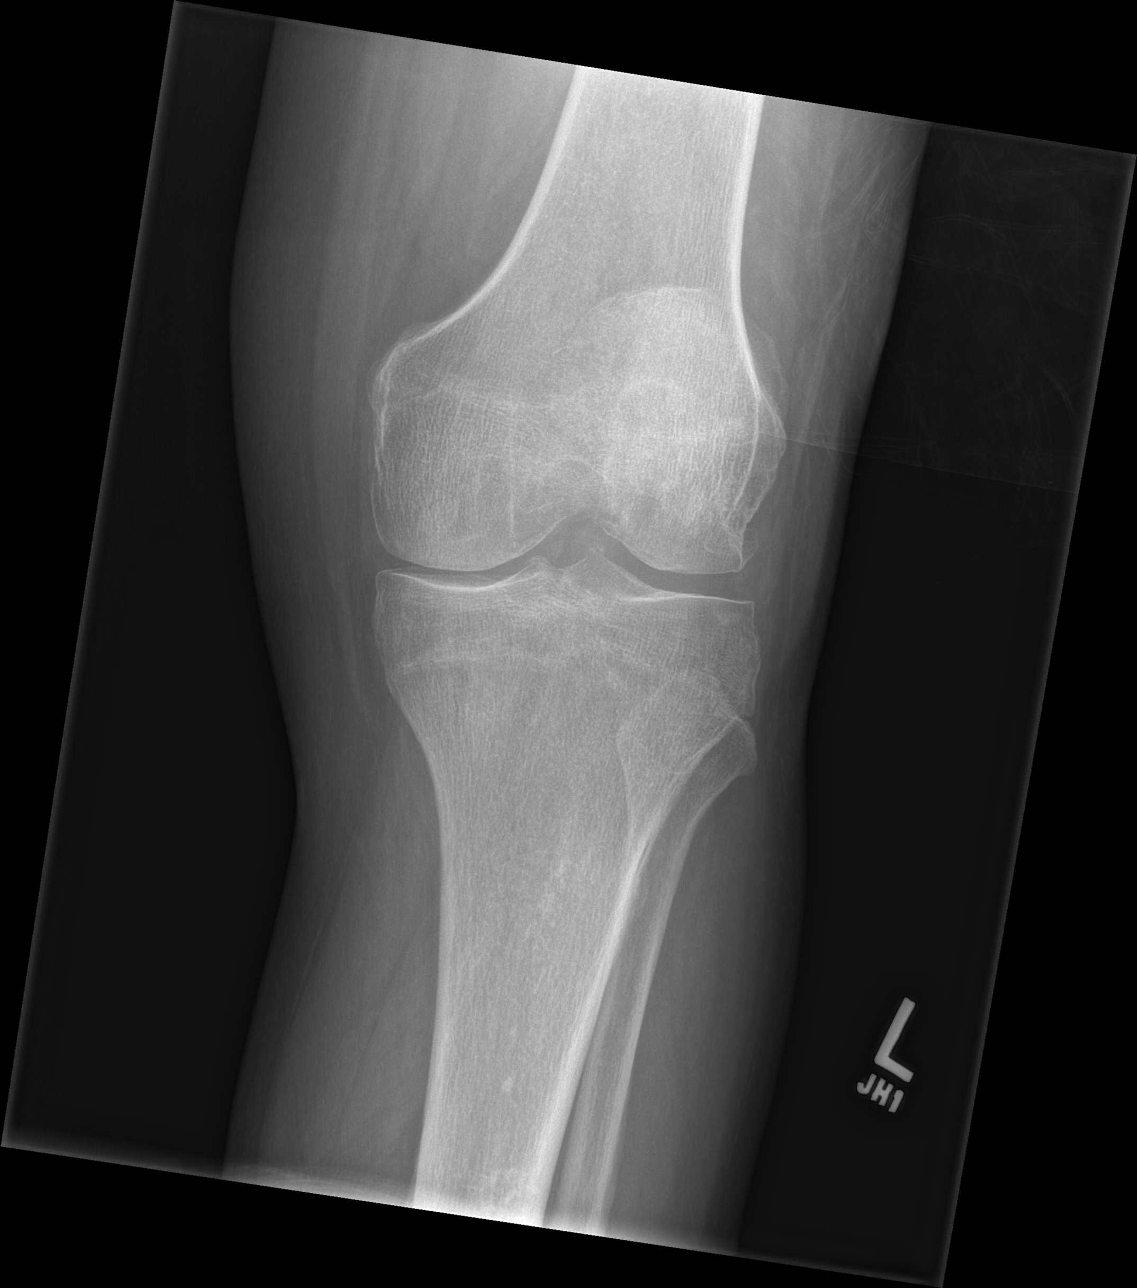

[x knee ap left]
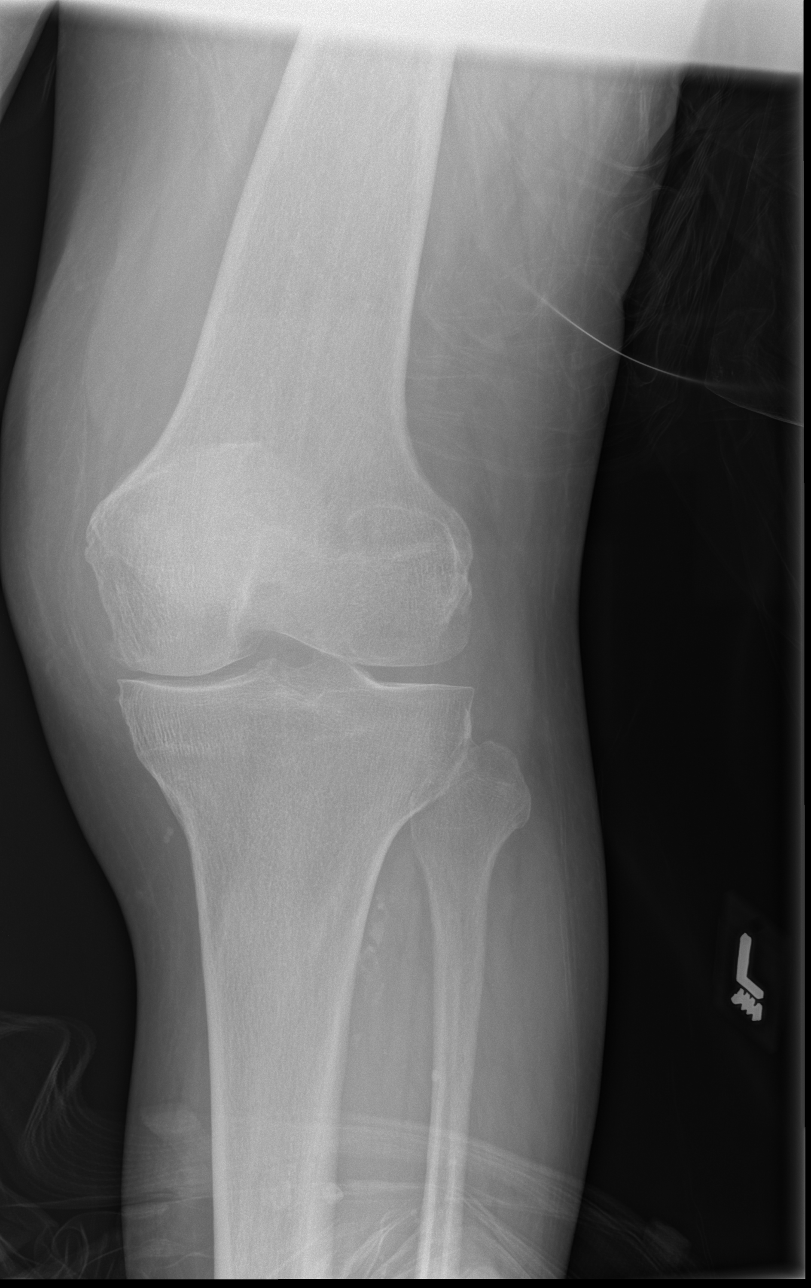

[t knee lat left]
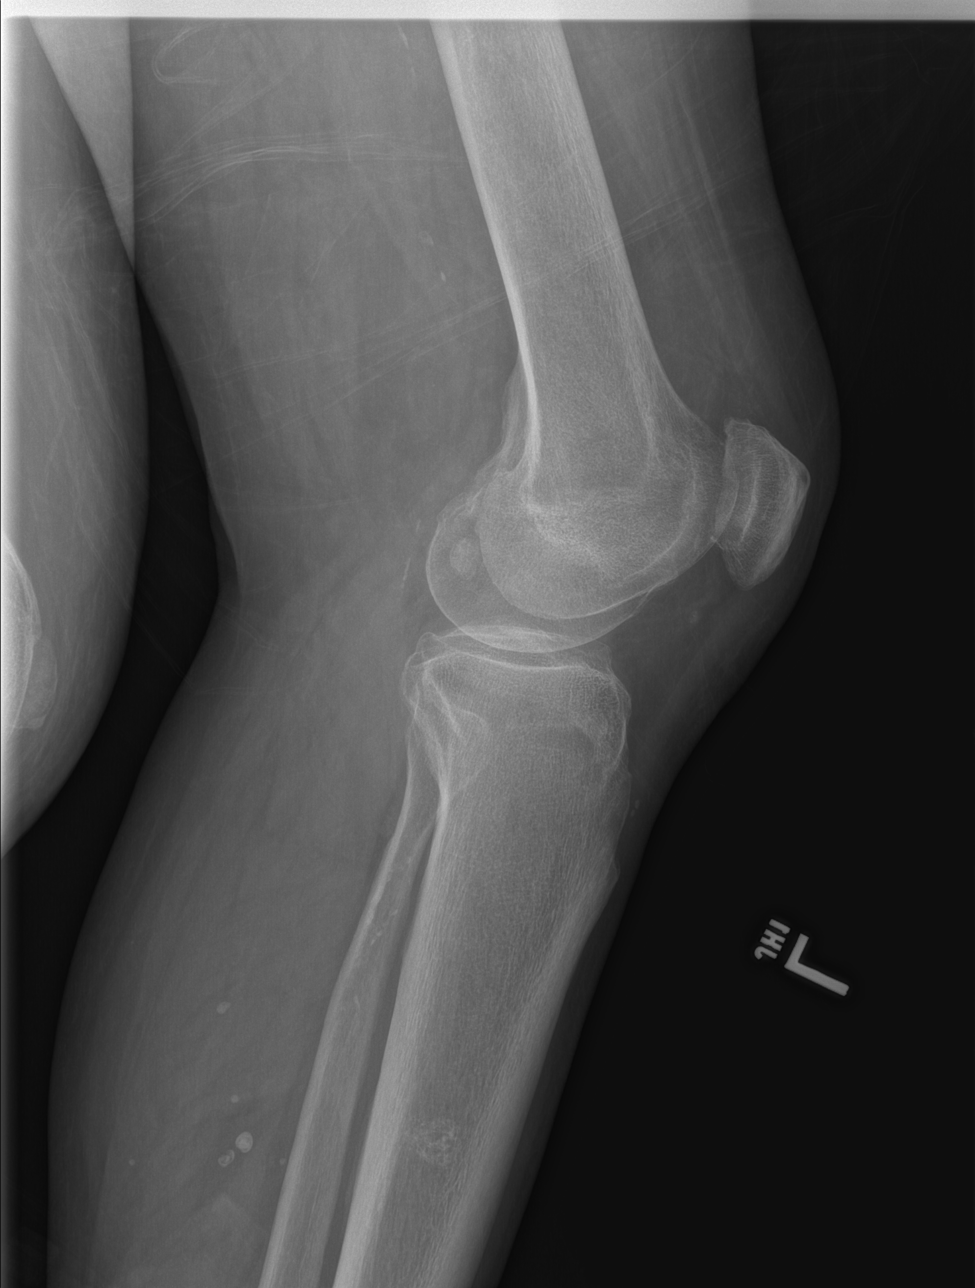

[4 of 4 positions shown; findings below may reference images not displayed]

FINDINGS: Moderate femorotibial and patellofemoral joint space narrowing
without fracture, joint dislocation nor joint effusion is
identified. No suspicious osseous lesions. Small cartilaginous rest
or enchondroma is noted of the proximal tibial diaphysis. Scattered
soft tissue phleboliths are noted of the proximal calf. Femoral
through tibial arteriosclerosis is identified.
IMPRESSION: Moderate femorotibial and patellofemoral joint space narrowing
without acute osseous abnormality.

## 2021-01-15 ENCOUNTER — Ambulatory Visit (INDEPENDENT_AMBULATORY_CARE_PROVIDER_SITE_OTHER): Payer: Medicare Other

## 2021-01-15 DIAGNOSIS — I442 Atrioventricular block, complete: Secondary | ICD-10-CM

## 2021-01-15 LAB — CUP PACEART REMOTE DEVICE CHECK
Date Time Interrogation Session: 20220725105325
Implantable Lead Implant Date: 20010822
Implantable Lead Implant Date: 20010822
Implantable Lead Location: 753859
Implantable Lead Location: 753860
Implantable Lead Model: 4035
Implantable Lead Model: 4064
Implantable Lead Serial Number: 302015
Implantable Lead Serial Number: 303342
Implantable Pulse Generator Implant Date: 20180108
Pulse Gen Model: 394931
Pulse Gen Serial Number: 68936255

## 2021-02-11 NOTE — Progress Notes (Signed)
Remote pacemaker transmission.   

## 2021-04-16 ENCOUNTER — Ambulatory Visit (INDEPENDENT_AMBULATORY_CARE_PROVIDER_SITE_OTHER): Payer: Medicare Other

## 2021-04-16 DIAGNOSIS — I442 Atrioventricular block, complete: Secondary | ICD-10-CM | POA: Diagnosis not present

## 2021-04-16 LAB — CUP PACEART REMOTE DEVICE CHECK
Date Time Interrogation Session: 20221025091151
Implantable Lead Implant Date: 20010822
Implantable Lead Implant Date: 20010822
Implantable Lead Location: 753859
Implantable Lead Location: 753860
Implantable Lead Model: 4035
Implantable Lead Model: 4064
Implantable Lead Serial Number: 302015
Implantable Lead Serial Number: 303342
Implantable Pulse Generator Implant Date: 20180108
Pulse Gen Model: 394931
Pulse Gen Serial Number: 68936255

## 2021-04-25 NOTE — Progress Notes (Signed)
Remote pacemaker transmission.   

## 2021-07-16 ENCOUNTER — Ambulatory Visit (INDEPENDENT_AMBULATORY_CARE_PROVIDER_SITE_OTHER): Payer: Medicare Other

## 2021-07-16 DIAGNOSIS — Z950 Presence of cardiac pacemaker: Secondary | ICD-10-CM

## 2021-07-16 DIAGNOSIS — I442 Atrioventricular block, complete: Secondary | ICD-10-CM | POA: Diagnosis not present

## 2021-07-16 LAB — CUP PACEART REMOTE DEVICE CHECK
Date Time Interrogation Session: 20230124080150
Implantable Lead Implant Date: 20010822
Implantable Lead Implant Date: 20010822
Implantable Lead Location: 753859
Implantable Lead Location: 753860
Implantable Lead Model: 4035
Implantable Lead Model: 4064
Implantable Lead Serial Number: 302015
Implantable Lead Serial Number: 303342
Implantable Pulse Generator Implant Date: 20180108
Pulse Gen Model: 394931
Pulse Gen Serial Number: 68936255

## 2021-07-26 NOTE — Progress Notes (Signed)
Remote pacemaker transmission.   

## 2021-10-15 ENCOUNTER — Ambulatory Visit (INDEPENDENT_AMBULATORY_CARE_PROVIDER_SITE_OTHER): Payer: Medicare Other

## 2021-10-15 DIAGNOSIS — I442 Atrioventricular block, complete: Secondary | ICD-10-CM

## 2021-10-15 DIAGNOSIS — Z950 Presence of cardiac pacemaker: Secondary | ICD-10-CM

## 2021-10-15 LAB — CUP PACEART REMOTE DEVICE CHECK
Date Time Interrogation Session: 20230425082455
Implantable Lead Implant Date: 20010822
Implantable Lead Implant Date: 20010822
Implantable Lead Location: 753859
Implantable Lead Location: 753860
Implantable Lead Model: 4035
Implantable Lead Model: 4064
Implantable Lead Serial Number: 302015
Implantable Lead Serial Number: 303342
Implantable Pulse Generator Implant Date: 20180108
Pulse Gen Model: 394931
Pulse Gen Serial Number: 68936255

## 2021-10-31 NOTE — Progress Notes (Signed)
Remote pacemaker transmission.   

## 2022-01-14 ENCOUNTER — Ambulatory Visit (INDEPENDENT_AMBULATORY_CARE_PROVIDER_SITE_OTHER): Payer: Medicare Other

## 2022-01-14 DIAGNOSIS — I442 Atrioventricular block, complete: Secondary | ICD-10-CM

## 2022-01-14 LAB — CUP PACEART REMOTE DEVICE CHECK
Date Time Interrogation Session: 20230725070116
Implantable Lead Implant Date: 20010822
Implantable Lead Implant Date: 20010822
Implantable Lead Location: 753859
Implantable Lead Location: 753860
Implantable Lead Model: 4035
Implantable Lead Model: 4064
Implantable Lead Serial Number: 302015
Implantable Lead Serial Number: 303342
Implantable Pulse Generator Implant Date: 20180108
Pulse Gen Model: 394931
Pulse Gen Serial Number: 68936255

## 2022-02-10 NOTE — Progress Notes (Signed)
Remote pacemaker transmission.   

## 2022-04-15 ENCOUNTER — Ambulatory Visit (INDEPENDENT_AMBULATORY_CARE_PROVIDER_SITE_OTHER): Payer: Medicare PPO

## 2022-04-15 DIAGNOSIS — I442 Atrioventricular block, complete: Secondary | ICD-10-CM | POA: Diagnosis not present

## 2022-04-17 LAB — CUP PACEART REMOTE DEVICE CHECK
Date Time Interrogation Session: 20231025094422
Implantable Lead Connection Status: 753985
Implantable Lead Connection Status: 753985
Implantable Lead Implant Date: 20010822
Implantable Lead Implant Date: 20010822
Implantable Lead Location: 753859
Implantable Lead Location: 753860
Implantable Lead Model: 4035
Implantable Lead Model: 4064
Implantable Lead Serial Number: 302015
Implantable Lead Serial Number: 303342
Implantable Pulse Generator Implant Date: 20180108
Pulse Gen Model: 394931
Pulse Gen Serial Number: 68936255

## 2022-05-05 NOTE — Progress Notes (Signed)
Remote pacemaker transmission.   

## 2022-07-15 ENCOUNTER — Ambulatory Visit: Payer: Medicare PPO | Attending: Internal Medicine

## 2022-07-15 DIAGNOSIS — I442 Atrioventricular block, complete: Secondary | ICD-10-CM | POA: Diagnosis not present

## 2022-07-15 LAB — CUP PACEART REMOTE DEVICE CHECK
Date Time Interrogation Session: 20240123105816
Implantable Lead Connection Status: 753985
Implantable Lead Connection Status: 753985
Implantable Lead Implant Date: 20010822
Implantable Lead Implant Date: 20010822
Implantable Lead Location: 753859
Implantable Lead Location: 753860
Implantable Lead Model: 4035
Implantable Lead Model: 4064
Implantable Lead Serial Number: 302015
Implantable Lead Serial Number: 303342
Implantable Pulse Generator Implant Date: 20180108
Pulse Gen Model: 394931
Pulse Gen Serial Number: 68936255

## 2022-08-12 NOTE — Progress Notes (Signed)
Remote pacemaker transmission.   

## 2022-10-14 ENCOUNTER — Ambulatory Visit (INDEPENDENT_AMBULATORY_CARE_PROVIDER_SITE_OTHER): Payer: Medicare PPO

## 2022-10-14 DIAGNOSIS — I442 Atrioventricular block, complete: Secondary | ICD-10-CM

## 2022-10-15 LAB — CUP PACEART REMOTE DEVICE CHECK
Battery Voltage: 45
Date Time Interrogation Session: 20240423081251
Implantable Lead Connection Status: 753985
Implantable Lead Connection Status: 753985
Implantable Lead Implant Date: 20010822
Implantable Lead Implant Date: 20010822
Implantable Lead Location: 753859
Implantable Lead Location: 753860
Implantable Lead Model: 4035
Implantable Lead Model: 4064
Implantable Lead Serial Number: 302015
Implantable Lead Serial Number: 303342
Implantable Pulse Generator Implant Date: 20180108
Pulse Gen Model: 394931
Pulse Gen Serial Number: 68936255

## 2022-11-12 NOTE — Progress Notes (Signed)
Remote pacemaker transmission.   

## 2023-01-13 ENCOUNTER — Ambulatory Visit (INDEPENDENT_AMBULATORY_CARE_PROVIDER_SITE_OTHER): Payer: Medicare PPO

## 2023-01-13 DIAGNOSIS — I442 Atrioventricular block, complete: Secondary | ICD-10-CM | POA: Diagnosis not present

## 2023-01-15 LAB — CUP PACEART REMOTE DEVICE CHECK
Battery Voltage: 40
Date Time Interrogation Session: 20240723092014
Implantable Lead Connection Status: 753985
Implantable Lead Implant Date: 20010822
Implantable Lead Implant Date: 20010822
Implantable Lead Location: 753859
Implantable Lead Model: 4035
Implantable Lead Model: 4064
Implantable Lead Serial Number: 302015
Implantable Lead Serial Number: 303342
Implantable Pulse Generator Implant Date: 20180108
Pulse Gen Model: 394931
Pulse Gen Serial Number: 68936255

## 2023-01-30 NOTE — Progress Notes (Signed)
Remote pacemaker transmission.   

## 2023-04-14 ENCOUNTER — Ambulatory Visit (INDEPENDENT_AMBULATORY_CARE_PROVIDER_SITE_OTHER): Payer: Medicare Other

## 2023-04-14 DIAGNOSIS — I442 Atrioventricular block, complete: Secondary | ICD-10-CM | POA: Diagnosis not present

## 2023-04-15 LAB — CUP PACEART REMOTE DEVICE CHECK
Battery Remaining Percentage: 40 %
Brady Statistic RA Percent Paced: 100 %
Brady Statistic RV Percent Paced: 100 %
Date Time Interrogation Session: 20241022071419
Implantable Lead Connection Status: 753985
Implantable Lead Connection Status: 753985
Implantable Lead Implant Date: 20010822
Implantable Lead Implant Date: 20010822
Implantable Lead Location: 753859
Implantable Lead Location: 753860
Implantable Lead Model: 4035
Implantable Lead Model: 4064
Implantable Lead Serial Number: 302015
Implantable Lead Serial Number: 303342
Implantable Pulse Generator Implant Date: 20180108
Lead Channel Impedance Value: 195 Ohm
Lead Channel Impedance Value: 819 Ohm
Lead Channel Sensing Intrinsic Amplitude: 6.8 mV
Lead Channel Setting Pacing Amplitude: 2.4 V
Lead Channel Setting Pacing Amplitude: 3 V
Lead Channel Setting Pacing Pulse Width: 0.75 ms
Pulse Gen Model: 394931
Pulse Gen Serial Number: 68936255

## 2023-05-01 NOTE — Progress Notes (Signed)
Remote pacemaker transmission.   

## 2023-07-14 ENCOUNTER — Ambulatory Visit (INDEPENDENT_AMBULATORY_CARE_PROVIDER_SITE_OTHER): Payer: Medicare Other

## 2023-07-14 DIAGNOSIS — I442 Atrioventricular block, complete: Secondary | ICD-10-CM | POA: Diagnosis not present

## 2023-07-14 LAB — CUP PACEART REMOTE DEVICE CHECK
Date Time Interrogation Session: 20250121080010
Implantable Lead Connection Status: 753985
Implantable Lead Connection Status: 753985
Implantable Lead Implant Date: 20010822
Implantable Lead Implant Date: 20010822
Implantable Lead Location: 753859
Implantable Lead Location: 753860
Implantable Lead Model: 4035
Implantable Lead Model: 4064
Implantable Lead Serial Number: 302015
Implantable Lead Serial Number: 303342
Implantable Pulse Generator Implant Date: 20180108
Pulse Gen Model: 394931
Pulse Gen Serial Number: 68936255

## 2023-08-25 NOTE — Progress Notes (Signed)
 Remote pacemaker transmission.

## 2023-10-13 ENCOUNTER — Ambulatory Visit (INDEPENDENT_AMBULATORY_CARE_PROVIDER_SITE_OTHER): Payer: Medicare Other

## 2023-10-13 DIAGNOSIS — I442 Atrioventricular block, complete: Secondary | ICD-10-CM

## 2023-10-14 LAB — CUP PACEART REMOTE DEVICE CHECK
Battery Voltage: 35
Date Time Interrogation Session: 20250422083718
Implantable Lead Connection Status: 753985
Implantable Lead Connection Status: 753985
Implantable Lead Implant Date: 20010822
Implantable Lead Implant Date: 20010822
Implantable Lead Location: 753859
Implantable Lead Location: 753860
Implantable Lead Model: 4035
Implantable Lead Model: 4064
Implantable Lead Serial Number: 302015
Implantable Lead Serial Number: 303342
Implantable Pulse Generator Implant Date: 20180108
Pulse Gen Model: 394931
Pulse Gen Serial Number: 68936255

## 2023-11-26 NOTE — Progress Notes (Signed)
 Remote pacemaker transmission.

## 2023-11-26 NOTE — Addendum Note (Signed)
 Addended by: Lott Rouleau A on: 11/26/2023 03:19 PM   Modules accepted: Orders

## 2024-01-07 ENCOUNTER — Ambulatory Visit: Attending: Internal Medicine | Admitting: Internal Medicine

## 2024-01-07 ENCOUNTER — Encounter: Payer: Self-pay | Admitting: Internal Medicine

## 2024-01-07 VITALS — BP 162/69 | HR 140 | Ht 64.0 in

## 2024-01-07 DIAGNOSIS — I495 Sick sinus syndrome: Secondary | ICD-10-CM | POA: Diagnosis not present

## 2024-01-07 DIAGNOSIS — I442 Atrioventricular block, complete: Secondary | ICD-10-CM

## 2024-01-07 NOTE — Patient Instructions (Signed)

## 2024-01-07 NOTE — Progress Notes (Signed)
 HPI Steve Chapman returns today after a long absence from our arrhythmia clinic. He is a pleasant 89 yo man with sinus node dysfunction and CHB. He has known undersensing as well as noise on his atrial lead and we have programmed him DVI at 70 in the past. In the interim, he denies chest pain or sob. No edema. His appetite is good. He has a remote DVT and was on eliquis  but is off of this now. He c/o difficulty falling asleep.  No Known Allergies   Current Outpatient Medications  Medication Sig Dispense Refill   acetaminophen  (TYLENOL ) 500 MG tablet Take 500 mg by mouth every 6 (six) hours as needed for mild pain.     No current facility-administered medications for this visit.     Past Medical History:  Diagnosis Date   Chest pain    DVT of upper extremity (deep vein thrombosis) ([facility])    Heart block    Intermittent pain and swelling of hand    RIGHT HAND   Osteopenia    Pacemaker     ROS:   All systems reviewed and negative except as noted in the HPI.   Past Surgical History:  Procedure Laterality Date   EP IMPLANTABLE DEVICE N/A 06/30/2016   Procedure: PPM Generator Changeout;  Surgeon: Steve LELON Birmingham, MD;  Location: [facility];  Service: Cardiovascular;  Laterality: N/A;   PACEMAKER INSERTION       No family history on file.   Social History   Socioeconomic History   Marital status: Single    Spouse name: Not on file   Number of children: Not on file   Years of education: Not on file   Highest education level: Not on file  Occupational History   Not on file  Tobacco Use   Smoking status: Never   Smokeless tobacco: Never  Substance and Sexual Activity   Alcohol use: Not on file   Drug use: Not on file   Sexual activity: Not on file  Other Topics Concern   Not on file  Social History Narrative   Not on file   Social Drivers of Health   Financial Resource Strain: Not on file  Food Insecurity: Not on file  Transportation Needs: Not on file   Physical Activity: Not on file  Stress: Not on file  Social Connections: Not on file  Intimate Partner Violence: Not on file     BP (!) 162/69   Pulse (!) 140   Ht 5' 4 (1.626 m)   SpO2 95%   BMI 23.00 kg/m   Physical Exam:  Well appearing NAD HEENT: Unremarkable Neck:  No JVD, no thyromegally Lymphatics:  No adenopathy Back:  No CVA tenderness Lungs:  Clear with no wheezes HEART:  Regular rate rhythm, no murmurs, no rubs, no clicks Abd:  soft, positive bowel sounds, no organomegally, no rebound, no guarding Ext:  2 plus pulses, no edema, no cyanosis, no clubbing Skin:  No rashes no nodules Neuro:  CN II through XII intact, motor grossly intact  EKG - AV pacing  DEVICE  Normal device function.  See PaceArt for details.   Assess/Plan:  1. CHB - he is not dependent. He is asymptomatic, s/p PPM. His escape came in at 33/min today. 2. PPM - his Biotronik device is functioning acceptable. He is undersensing the atrium and will will continue to AV pace at 70. I would anticipate having an atrial lead placed at gen  change. 3. DVT - he is now off his eliquis .    Steve Chapman

## 2024-01-12 ENCOUNTER — Ambulatory Visit (INDEPENDENT_AMBULATORY_CARE_PROVIDER_SITE_OTHER)

## 2024-01-12 DIAGNOSIS — I442 Atrioventricular block, complete: Secondary | ICD-10-CM

## 2024-01-13 ENCOUNTER — Ambulatory Visit: Payer: Self-pay | Admitting: Internal Medicine

## 2024-01-13 LAB — CUP PACEART REMOTE DEVICE CHECK
Date Time Interrogation Session: 20250722100532
Implantable Lead Connection Status: 753985
Implantable Lead Connection Status: 753985
Implantable Lead Implant Date: 20010822
Implantable Lead Implant Date: 20010822
Implantable Lead Location: 753859
Implantable Lead Location: 753860
Implantable Lead Model: 4035
Implantable Lead Model: 4064
Implantable Lead Serial Number: 302015
Implantable Lead Serial Number: 303342
Implantable Pulse Generator Implant Date: 20180108
Pulse Gen Model: 394931
Pulse Gen Serial Number: 68936255

## 2024-03-02 ENCOUNTER — Telehealth: Payer: Self-pay

## 2024-03-02 NOTE — Telephone Encounter (Signed)
 Discussed with BIOTRONIK.  The only way to keep atrial lead functioning BIPOLAR would be to disable enable lead switch.  If this programming change was made it would disable this function for RA and RV lead.  Because Pt is dependent in the RV will NOT make this programming change and continue to monitor RA lead for noise.

## 2024-03-25 NOTE — Progress Notes (Signed)
 Remote PPM Transmission

## 2024-04-12 ENCOUNTER — Ambulatory Visit

## 2024-04-12 DIAGNOSIS — I442 Atrioventricular block, complete: Secondary | ICD-10-CM

## 2024-04-14 ENCOUNTER — Ambulatory Visit: Payer: Self-pay | Admitting: Internal Medicine

## 2024-04-14 LAB — CUP PACEART REMOTE DEVICE CHECK
Date Time Interrogation Session: 20251021090318
Implantable Lead Connection Status: 753985
Implantable Lead Connection Status: 753985
Implantable Lead Implant Date: 20010822
Implantable Lead Implant Date: 20010822
Implantable Lead Location: 753859
Implantable Lead Location: 753860
Implantable Lead Model: 4035
Implantable Lead Model: 4064
Implantable Lead Serial Number: 302015
Implantable Lead Serial Number: 303342
Implantable Pulse Generator Implant Date: 20180108
Pulse Gen Model: 394931
Pulse Gen Serial Number: 68936255

## 2024-04-15 NOTE — Progress Notes (Signed)
 Remote PPM Transmission

## 2024-07-12 ENCOUNTER — Ambulatory Visit

## 2024-07-12 DIAGNOSIS — I442 Atrioventricular block, complete: Secondary | ICD-10-CM | POA: Diagnosis not present

## 2024-07-13 LAB — CUP PACEART REMOTE DEVICE CHECK
Battery Voltage: 30
Date Time Interrogation Session: 20260120093814
Implantable Lead Connection Status: 753985
Implantable Lead Connection Status: 753985
Implantable Lead Implant Date: 20010822
Implantable Lead Implant Date: 20010822
Implantable Lead Location: 753859
Implantable Lead Location: 753860
Implantable Lead Model: 4035
Implantable Lead Model: 4064
Implantable Lead Serial Number: 302015
Implantable Lead Serial Number: 303342
Implantable Pulse Generator Implant Date: 20180108
Pulse Gen Model: 394931
Pulse Gen Serial Number: 68936255

## 2024-07-15 NOTE — Progress Notes (Signed)
 Remote PPM Transmission

## 2024-07-19 ENCOUNTER — Ambulatory Visit: Payer: Self-pay | Admitting: Cardiovascular Disease

## 2024-10-11 ENCOUNTER — Ambulatory Visit

## 2025-01-10 ENCOUNTER — Ambulatory Visit

## 2025-04-11 ENCOUNTER — Ambulatory Visit
